# Patient Record
Sex: Female | Born: 1995 | Race: White | Hispanic: No | Marital: Single | Smoking: Current every day smoker
Health system: Southern US, Community
[De-identification: ages and names within clinical notes are randomized; demographics above are authoritative.]

## PROBLEM LIST (undated history)

## (undated) DIAGNOSIS — H53009 Unspecified amblyopia, unspecified eye: Secondary | ICD-10-CM

## (undated) DIAGNOSIS — H5200 Hypermetropia, unspecified eye: Secondary | ICD-10-CM

## (undated) HISTORY — DX: Hypermetropia, unspecified eye: H52.00

## (undated) HISTORY — DX: Unspecified amblyopia, unspecified eye: H53.009

---
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
## 2017-03-07 ENCOUNTER — Encounter: Payer: Self-pay | Admitting: Physician Assistant

## 2017-04-05 ENCOUNTER — Emergency Department (HOSPITAL_COMMUNITY)
Admission: EM | Admit: 2017-04-05 | Discharge: 2017-04-05 | Disposition: A | Discharge status: Home / Self Care | Payer: BLUE CROSS/BLUE SHIELD | Attending: Emergency Medicine | Admitting: Emergency Medicine

## 2017-04-05 ENCOUNTER — Emergency Department (HOSPITAL_COMMUNITY): Payer: BLUE CROSS/BLUE SHIELD

## 2017-04-05 ENCOUNTER — Encounter (HOSPITAL_COMMUNITY): Payer: Self-pay | Admitting: *Deleted

## 2017-04-05 DIAGNOSIS — R1011 Right upper quadrant pain: Secondary | ICD-10-CM | POA: Diagnosis not present

## 2017-04-05 DIAGNOSIS — K8050 Calculus of bile duct without cholangitis or cholecystitis without obstruction: Secondary | ICD-10-CM | POA: Insufficient documentation

## 2017-04-05 DIAGNOSIS — K828 Other specified diseases of gallbladder: Secondary | ICD-10-CM

## 2017-04-05 DIAGNOSIS — R110 Nausea: Secondary | ICD-10-CM | POA: Insufficient documentation

## 2017-04-05 LAB — URINALYSIS, ROUTINE W REFLEX MICROSCOPIC
Bilirubin Urine: NEGATIVE
GLUCOSE, UA: NEGATIVE mg/dL
HGB URINE DIPSTICK: NEGATIVE
KETONES UR: NEGATIVE mg/dL
NITRITE: NEGATIVE
PROTEIN: NEGATIVE mg/dL
Specific Gravity, Urine: 1.025 (ref 1.005–1.030)
pH: 5 (ref 5.0–8.0)

## 2017-04-05 LAB — CBC
HCT: 46.8 % — ABNORMAL HIGH (ref 36.0–46.0)
Hemoglobin: 15.6 g/dL — ABNORMAL HIGH (ref 12.0–15.0)
MCH: 31 pg (ref 26.0–34.0)
MCHC: 33.3 g/dL (ref 30.0–36.0)
MCV: 93 fL (ref 78.0–100.0)
Platelets: 255 10*3/uL (ref 150–400)
RBC: 5.03 MIL/uL (ref 3.87–5.11)
RDW: 12.3 % (ref 11.5–15.5)
WBC: 8 10*3/uL (ref 4.0–10.5)

## 2017-04-05 LAB — COMPREHENSIVE METABOLIC PANEL
ALT: 19 U/L (ref 14–54)
AST: 20 U/L (ref 15–41)
Albumin: 4.4 g/dL (ref 3.5–5.0)
Alkaline Phosphatase: 73 U/L (ref 38–126)
Anion gap: 6 (ref 5–15)
BILIRUBIN TOTAL: 0.4 mg/dL (ref 0.3–1.2)
BUN: 9 mg/dL (ref 6–20)
CHLORIDE: 106 mmol/L (ref 101–111)
CO2: 27 mmol/L (ref 22–32)
Calcium: 9.4 mg/dL (ref 8.9–10.3)
Creatinine, Ser: 0.67 mg/dL (ref 0.44–1.00)
GFR calc Af Amer: >60 mL/min (ref >60)
GFR calc non Af Amer: >60 mL/min (ref >60)
Glucose, Bld: 94 mg/dL (ref 65–99)
Potassium: 4.1 mmol/L (ref 3.5–5.1)
Sodium: 139 mmol/L (ref 135–145)
Total Protein: 7.6 g/dL (ref 6.5–8.1)

## 2017-04-05 LAB — LIPASE, BLOOD: LIPASE: 18 U/L (ref 11–51)

## 2017-04-05 LAB — I-STAT BETA HCG BLOOD, ED (MC, WL, AP ONLY): I-stat hCG, quantitative: <5 m[IU]/mL

## 2017-04-05 MED ORDER — NAPROXEN 250 MG PO TABS: 250.0000 mg | ORAL_TABLET | Freq: Two times a day (BID) | ORAL | 0 refills | Status: DC

## 2017-04-05 MED ORDER — GI COCKTAIL ~~LOC~~
30.0000 mL | Freq: Once | ORAL | Status: AC
  Administered 2017-04-05: 30 mL via ORAL
  Filled 2017-04-05: qty 30

## 2017-04-05 MED ORDER — ONDANSETRON 4 MG PO TBDP: 4.0000 mg | ORAL_TABLET | Freq: Three times a day (TID) | ORAL | 0 refills | Status: DC | PRN

## 2017-04-05 MED ORDER — ONDANSETRON HCL 4 MG/2ML IJ SOLN
4.0000 mg | INTRAMUSCULAR | Status: AC
  Administered 2017-04-05: 4 mg via INTRAVENOUS
  Filled 2017-04-05: qty 2

## 2017-04-05 MED ORDER — ONDANSETRON 4 MG PO TBDP
4.0000 mg | ORAL_TABLET | Freq: Once | ORAL | Status: AC
  Administered 2017-04-05: 4 mg via ORAL
  Filled 2017-04-05: qty 1

## 2017-04-05 MED ORDER — OMEPRAZOLE 20 MG PO CPDR: 20.0000 mg | DELAYED_RELEASE_CAPSULE | Freq: Every day | ORAL | 0 refills | Status: DC

## 2017-04-05 MED ORDER — MORPHINE SULFATE (PF) 4 MG/ML IV SOLN
4.0000 mg | Freq: Once | INTRAVENOUS | Status: AC
  Administered 2017-04-05: 4 mg via INTRAVENOUS
  Filled 2017-04-05: qty 1

## 2017-04-05 NOTE — ED Provider Notes (Signed)
[facility]EM[facility]ENCY DEPT Provider Note   CSN: [id number] Arrival date & time: 04/05/17  1315     History   Chief Complaint Chief Complaint  Patient presents with  . Abdominal Pain    HPI Robin Morris is a 21 y.o. female.  Robin Morris is a 21 y.o. Female who presents to the ED with her mother complaining of right upper quadrant abdominal pain ongoing for the past 5 days. Patient was seen at urgent care today and was sent for evaluation in the emergency department. Patient reports for 5 days she's had intermittent pain to her right upper quadrant. She reports her pain is worse about 2 hours after eating. She also reports associated burping and belching and symptoms of acid reflux. She has taken nothing for treatment of her symptoms today. She reports having some nausea and vomiting earlier in the week, but none currently. No previous abdominal surgeries. She denies fevers, chest pain, shortness of breath, lower abdominal pain, urinary symptoms, vaginal bleeding, vaginal discharge or rashes. LMP was 03/06/17.    The history is provided by the patient, a parent and medical records. No language interpreter was used.    Past Medical History:  Diagnosis Date  . Amblyopia   . Farsightedness     Patient Active Problem List   Diagnosis Date Noted  . Constipation 02/22/2016  . Vitamin D deficiency 02/22/2016  . Medication management 02/22/2016  . Acne 05/16/2012    History reviewed. No pertinent surgical history.  OB History    No data available       Home Medications    Prior to Admission medications   Medication Sig Start Date End Date Taking? Authorizing Provider  naproxen (NAPROSYN) 250 MG tablet Take 1 tablet (250 mg total) by mouth 2 (two) times daily with a meal. 04/05/17   Everlene Farrier, PA-C  omeprazole (PRILOSEC) 20 MG capsule Take 1 capsule (20 mg total) by mouth daily. 04/05/17   Everlene Farrier, PA-C  ondansetron (ZOFRAN ODT) 4 MG disintegrating tablet Take 1  tablet (4 mg total) by mouth every 8 (eight) hours as needed for nausea or vomiting. 04/05/17   Everlene Farrier, PA-C  SULFACLEANSE 8/4 8-4 % SUSP  12/15/15   Historical Provider, MD  TRI-LO-ESTARYLLA 0.18/0.215/0.25 MG-25 MCG tab  02/03/16   Historical Provider, MD    Family History Family History  Problem Relation Age of Onset  . Hypertension Father   . Heart disease Father     Social History Social History  Substance Use Topics  . Smoking status: Never Smoker  . Smokeless tobacco: Never Used  . Alcohol use Yes     Allergies   Patient has no known allergies.   Review of Systems Review of Systems  Constitutional: Negative for chills and fever.  HENT: Negative for congestion and sore throat.   Eyes: Negative for visual disturbance.  Respiratory: Negative for cough and shortness of breath.   Cardiovascular: Negative for chest pain.  Gastrointestinal: Positive for abdominal pain and nausea. Negative for abdominal distention, blood in stool, diarrhea and vomiting.  Genitourinary: Negative for difficulty urinating, dysuria, frequency, hematuria, urgency, vaginal bleeding and vaginal discharge.  Musculoskeletal: Negative for back pain and neck pain.  Skin: Negative for rash.  Neurological: Negative for headaches.     Physical Exam Updated Vital Signs BP 106/62   Pulse 75   Temp 98.8 F (37.1 C) (Oral)   Resp 16   LMP 03/06/2017   SpO2 98%   Physical Exam  Constitutional: She appears well-developed and well-nourished. No distress.  Nontoxic appearing.  HENT:  Head: Normocephalic and atraumatic.  Mouth/Throat: Oropharynx is clear and moist.  Eyes: Conjunctivae are normal. Pupils are equal, round, and reactive to light. Right eye exhibits no discharge. Left eye exhibits no discharge.  Neck: Neck supple.  Cardiovascular: Normal rate, regular rhythm, normal heart sounds and intact distal pulses.  Exam reveals no gallop and no friction rub.   No murmur  heard. Pulmonary/Chest: Effort normal and breath sounds normal. No respiratory distress. She has no wheezes. She has no rales.  Abdominal: Soft. Bowel sounds are normal. She exhibits no distension and no mass. There is tenderness. There is no rebound and no guarding.  Right upper quadrant tenderness to palpation. No CVA or flank tenderness. No lower abdominal tenderness to palpation. No peritoneal signs.  Musculoskeletal: She exhibits no edema.  Lymphadenopathy:    She has no cervical adenopathy.  Neurological: She is alert. Coordination normal.  Skin: Skin is warm and dry. No rash noted. She is not diaphoretic. No erythema. No pallor.  Psychiatric: She has a normal mood and affect. Her behavior is normal.  Nursing note and vitals reviewed.    ED Treatments / Results  Labs (all labs ordered are listed, but only abnormal results are displayed) Labs Reviewed  CBC - Abnormal; Notable for the following:       Result Value   Hemoglobin 15.6 (*)    HCT 46.8 (*)    All other components within normal limits  URINALYSIS, ROUTINE W REFLEX MICROSCOPIC - Abnormal; Notable for the following:    APPearance HAZY (*)    Leukocytes, UA TRACE (*)    Bacteria, UA RARE (*)    Squamous Epithelial / LPF 6-30 (*)    All other components within normal limits  LIPASE, BLOOD  COMPREHENSIVE METABOLIC PANEL  I-STAT BETA HCG BLOOD, ED ([facility], [facility], [facility] ONLY)    EKG  EKG Interpretation None       Radiology US Abdomen Limited  Result Date: 04/05/2017 CLINICAL DATA:  Right upper quadrant abdominal pain EXAM: US ABDOMEN LIMITED - RIGHT UPPER QUADRANT COMPARISON:  04/15/2014 CT FINDINGS: Gallbladder: No gallstones or wall thickening visualized. There may be some minimal dependent biliary sludge. No sonographic Murphy sign noted by sonographer. Common bile duct: Diameter: 2.5 mm Liver: No focal lesion identified. Within normal limits in parenchymal echogenicity. IMPRESSION: No evidence cholecystitis. There may be  some minimal dependent biliary sludge within the gallbladder on some of the images provided. Electronically Signed   By: Tollie Eth M.D.   On: 04/05/2017 19:19    Procedures Procedures (including critical care time)  Medications Ordered in ED Medications  gi cocktail (Maalox,Lidocaine,Donnatal) (30 mLs Oral Given 04/05/17 1716)  ondansetron (ZOFRAN-ODT) disintegrating tablet 4 mg (4 mg Oral Given 04/05/17 1715)  morphine 4 MG/ML injection 4 mg (4 mg Intravenous Given 04/05/17 1847)  ondansetron (ZOFRAN) injection 4 mg (4 mg Intravenous Given 04/05/17 1844)     Initial Impression / Assessment and Plan / ED Course  I have reviewed the triage vital signs and the nursing notes.  Pertinent labs & imaging results that were available during my care of the patient were reviewed by me and considered in my medical decision making (see chart for details).    This is a 21 y.o. Female who presents to the ED with her mother complaining of right upper quadrant abdominal pain ongoing for the past 5 days. Patient was seen at urgent care  today and was sent for evaluation in the emergency department. Patient reports for 5 days she's had intermittent pain to her right upper quadrant. She reports her pain is worse about 2 hours after eating. She also reports associated burping and belching and symptoms of acid reflux. She has taken nothing for treatment of her symptoms today. She reports having some nausea and vomiting earlier in the week, but none currently.  On exam the patient is afebrile and non-toxic appearing. Her abdomen is soft and she has right upper quadrant abdominal tenderness to palpation. No peritoneal signs. Pregnancy test is negative. Urinalysis is nitrite negative with trace leukocytes patient denies any urinary symptoms. Lipase is within normal limits. CMP is unremarkable. Normal liver enzymes. CBC is unremarkable. No leukocytosis. Ultrasound of her right upper quadrant reveals no evidence of  cholecystitis. It does show some biliary sludge within the gallbladder. Suspect biliary colic and gallbladder sludge is causing this patient's pain. Reevaluation patient reports she is feeling better. She is tolerating by mouth. Will discharge at this time with prescriptions for Zofran, naproxen and omeprazole. I encouraged her to follow-up with general surgery practice for possible elective cholecystectomy. I discussed strict and specific return precautions with the patient. I advised the patient to follow-up with their primary care provider this week. I advised the patient to return to the emergency department with new or worsening symptoms or new concerns. The patient verbalized understanding and agreement with plan.      Final Clinical Impressions(s) / ED Diagnoses   Final diagnoses:  Gallbladder sludge  Biliary colic  Nausea    New Prescriptions New Prescriptions   NAPROXEN (NAPROSYN) 250 MG TABLET    Take 1 tablet (250 mg total) by mouth 2 (two) times daily with a meal.   OMEPRAZOLE (PRILOSEC) 20 MG CAPSULE    Take 1 capsule (20 mg total) by mouth daily.   ONDANSETRON (ZOFRAN ODT) 4 MG DISINTEGRATING TABLET    Take 1 tablet (4 mg total) by mouth every 8 (eight) hours as needed for nausea or vomiting.     Everlene Farrier, PA-C 04/05/17 2007    Gwyneth Sprout, MD 04/05/17 [phone]

## 2017-04-05 NOTE — ED Notes (Signed)
Pt verbalized understanding discharge instructions and denies any further needs or questions at this time. VS stable, ambulatory and steady gait.   

## 2017-04-05 NOTE — ED Triage Notes (Signed)
Pt reports R abd pain intermittent x 6 days, sent from UC for r/o gallbladder work up, pt reports x 2 vomiting episode since onset of symptoms, pt denies current n/v, reports x 1 liquid stool today, pt reports pain radiating to to mid back, A&O x4

## 2017-04-08 ENCOUNTER — Encounter: Payer: Self-pay | Admitting: Physician Assistant

## 2017-04-08 ENCOUNTER — Ambulatory Visit (INDEPENDENT_AMBULATORY_CARE_PROVIDER_SITE_OTHER): Payer: BLUE CROSS/BLUE SHIELD | Admitting: Physician Assistant

## 2017-04-08 VITALS — BP 118/74 | HR 103 | Temp 97.3°F | Resp 16 | Ht 66.0 in | Wt 170.6 lb

## 2017-04-08 DIAGNOSIS — E663 Overweight: Secondary | ICD-10-CM

## 2017-04-08 DIAGNOSIS — K829 Disease of gallbladder, unspecified: Secondary | ICD-10-CM

## 2017-04-08 MED ORDER — PHENTERMINE HCL 37.5 MG PO TABS: 37.5000 mg | ORAL_TABLET | Freq: Every day | ORAL | 0 refills | Status: DC

## 2017-04-08 NOTE — Progress Notes (Signed)
   Subjective:    Patient ID: Robin Morris, female    DOB: 10-Apr-1996, 21 y.o.   MRN: [id number]  HPI 21 y.o. WF presents for follow up from ER but also has issues with weight loss. She went to the ER with gallbladder pain, now has visit with surgeon on may 16th. Normal labs in the ER. No fever, chills.   BMI is Body mass index is 27.54 kg/m., she is working on diet and exercise but has been eating grits, salads of [employer] and regular soda.  Wt Readings from Last 3 Encounters:  04/08/17 170 lb 9.6 oz (77.4 kg) (92 %, Z= 1.43)*  02/22/16 174 lb 12.8 oz (79.3 kg) (94 %, Z= 1.59)*  11/11/14 150 lb (68 kg) (86 %, Z= 1.10)*   * Growth percentiles are based on CDC 2-20 Years data.   Blood pressure 118/74, pulse (!) 103, temperature 97.3 F (36.3 C), resp. rate 16, height  (1.676 m), weight 170 lb 9.6 oz (77.4 kg), last menstrual period 03/08/2017, SpO2 99 %.   Review of Systems  Constitutional: Negative.   HENT: Negative.  Negative for sore throat.   Eyes: Negative.   Respiratory: Negative for cough, choking, shortness of breath and wheezing.   Cardiovascular: Negative.  Negative for chest pain.  Gastrointestinal: Positive for abdominal distention, abdominal pain and nausea. Negative for anal bleeding, blood in stool, constipation, diarrhea, rectal pain and vomiting.  Genitourinary: Negative.   Musculoskeletal: Negative.   Skin: Negative.   Neurological: Negative.   Hematological: Negative.   Psychiatric/Behavioral: Negative.        Objective:   Physical Exam  Constitutional: She is oriented to person, place, and time. She appears well-developed and well-nourished.  HENT:  Head: Normocephalic and atraumatic.  Right Ear: External ear normal.  Left Ear: External ear normal.  Mouth/Throat: Oropharynx is clear and moist.  Eyes: Conjunctivae and EOM are normal. Pupils are equal, round, and reactive to light.  Neck: Normal range of motion. Neck supple. No thyromegaly present.   Cardiovascular: Normal rate, regular rhythm and normal heart sounds.  Exam reveals no gallop and no friction rub.   No murmur heard. Pulmonary/Chest: Effort normal and breath sounds normal. No respiratory distress. She has no wheezes.  Abdominal: Soft. Bowel sounds are normal. She exhibits no shifting dullness, no distension, no abdominal bruit, no pulsatile midline mass and no mass. There is no hepatosplenomegaly. There is tenderness in the right upper quadrant. There is positive Murphy's sign. There is no rigidity, no rebound, no guarding, no CVA tenderness and no tenderness at McBurney's point. No hernia.  Musculoskeletal: Normal range of motion.  Lymphadenopathy:    She has no cervical adenopathy.  Neurological: She is alert and oriented to person, place, and time.  Skin: Skin is warm and dry.  Psychiatric: She has a normal mood and affect.       Assessment & Plan:    Overweight  - long discussion about weight loss, diet, and exercise --     phentermine (ADIPEX-P) 37.5 MG tablet; Take 1 tablet (37.5 mg total) by mouth daily before breakfast.  Gallbladder attack Diet discussed Follow up surgery Go to ER if any worsening symptoms  1 month will check labs that visit at [facility]   No future appointments.

## 2017-04-08 NOTE — Patient Instructions (Addendum)
Cholelithiasis Cholelithiasis is a form of gallbladder disease in which gallstones form in the gallbladder. The gallbladder is an organ that stores bile. Bile is made in the liver, and it helps to digest fats. Gallstones begin as small crystals and slowly grow into stones. They may cause no symptoms until the gallbladder tightens (contracts) and a gallstone is blocking the duct (gallbladder attack), which can cause pain. Cholelithiasis is also referred to as gallstones. There are two main types of gallstones:  Cholesterol stones. These are made of hardened cholesterol and are usually yellow-green in color. They are the most common type of gallstone. Cholesterol is a white, waxy, fat-like substance that is made in the liver.  Pigment stones. These are dark in color and are made of a red-yellow substance that forms when hemoglobin from red blood cells breaks down (bilirubin). What are the causes? This condition may be caused by an imbalance in the substances that bile is made of. This can happen if the bile:  Has too much bilirubin.  Has too much cholesterol.  Does not have enough bile salts. These salts help the body absorb and digest fats. In some cases, this condition can also be caused by the gallbladder not emptying completely or often enough. What increases the risk? The following factors may make you more likely to develop this condition:  Being female.  Having multiple pregnancies. Health care providers sometimes advise removing diseased gallbladders before future pregnancies.  Eating a diet that is heavy in fried foods, fat, and refined carbohydrates, like white bread and white rice.  Being obese.  Being older than age 40.  Prolonged use of medicines that contain female hormones (estrogen).  Having diabetes mellitus.  Rapidly losing weight.  Having a family history of gallstones.  Being of American Indian or [place] descent.  Having an intestinal disease such as Crohn  disease.  Having metabolic syndrome.  Having cirrhosis.  Having severe types of anemia such as sickle cell anemia. What are the signs or symptoms? In most cases, there are no symptoms. These are known as silent gallstones. If a gallstone blocks the bile ducts, it can cause a gallbladder attack. The main symptom of a gallbladder attack is sudden pain in the upper right abdomen. The pain usually comes at night or after eating a large meal. The pain can last for one or several hours and can spread to the right shoulder or chest. If the bile duct is blocked for more than a few hours, it can cause infection or inflammation of the gallbladder, liver, or pancreas, which may cause:  Nausea.  Vomiting.  Abdominal pain that lasts for 5 hours or more.  Fever or chills.  Yellowing of the skin or the whites of the eyes (jaundice).  Dark urine.  Light-colored stools. How is this diagnosed? This condition may be diagnosed based on:  A physical exam.  Your medical history.  An ultrasound of your gallbladder.  CT scan.  MRI.  Blood tests to check for signs of infection or inflammation.  A scan of your gallbladder and bile ducts (biliary system) using nonharmful radioactive material and special cameras that can see the radioactive material (cholescintigram). This test checks to see how your gallbladder contracts and whether bile ducts are blocked.  Inserting a small tube with a camera on the end (endoscope) through your mouth to inspect bile ducts and check for blockages (endoscopic retrograde cholangiopancreatogram). How is this treated? Treatment for gallstones depends on the severity of the condition. Silent   gallstones do not need treatment. If the gallstones cause a gallbladder attack or other symptoms, treatment may be required. Options for treatment include:  Surgery to remove the gallbladder (cholecystectomy). This is the most common treatment.  Medicines to dissolve gallstones.  These are most effective at treating small gallstones. You may need to take medicines for up to 6-12 months.  Shock wave treatment (extracorporeal biliary lithotripsy). In this treatment, an ultrasound machine sends shock waves to the gallbladder to break gallstones into smaller pieces. These pieces can then be passed into the intestines or be dissolved by medicine. This is rarely used.  Removing gallstones through endoscopic retrograde cholangiopancreatogram. A small basket can be attached to the endoscope and used to capture and remove gallstones. Follow these instructions at home:  Take over-the-counter and prescription medicines only as told by your health care provider.  Maintain a healthy weight and follow a healthy diet. This includes:  Reducing fatty foods, such as fried food.  Reducing refined carbohydrates, like white bread and white rice.  Increasing fiber. Aim for foods like almonds, fruit, and beans.  Keep all follow-up visits as told by your health care provider. This is important. Contact a health care provider if:  You think you have had a gallbladder attack.  You have been diagnosed with silent gallstones and you develop abdominal pain or indigestion. Get help right away if:  You have pain from a gallbladder attack that lasts for more than 2 hours.  You have abdominal pain that lasts for more than 5 hours.  You have a fever or chills.  You have persistent nausea and vomiting.  You develop jaundice.  You have dark urine or light-colored stools. Summary  Cholelithiasis (also called gallstones) is a form of gallbladder disease in which gallstones form in the gallbladder.  This condition is caused by an imbalance in the substances that make up bile. This can happen if the bile has too much cholesterol, too much bilirubin, or not enough bile salts.  You are more likely to develop this condition if you are female, pregnant, using medicines with estrogen, obese,  older than age 19, or have a family history of gallstones. You may also develop gallstones if you have diabetes, an intestinal disease, cirrhosis, or metabolic syndrome.  Treatment for gallstones depends on the severity of the condition. Silent gallstones do not need treatment.  If gallstones cause a gallbladder attack or other symptoms, treatment may be needed. The most common treatment is surgery to remove the gallbladder. This information is not intended to replace advice given to you by your health care provider. Make sure you discuss any questions you have with your health care provider. Document Released: 11/22/2005 Document Revised: 08/12/2016 Document Reviewed: 08/12/2016 Elsevier Interactive Patient Education  2017 [employer]  ADD ON VITAMIN D 5000 IU AND ADD ON B COMPLEX   Get my fitness pal or lose it app on your phone and bring me back a food diary  Phentermine  While taking the medication we may ask that you come into the office once a month or once every 2-3 months to monitor your weight, blood pressure, and heart rate. In addition we can help answer your questions about diet, exercise, and help you every step of the way with your weight loss journey. Sometime it is helpful if you bring in a food diary or use an app on your phone such as myfitnesspal to record your calorie intake, especially in the beginning.   You can start  out on 1/3 to 1/2 a pill in the morning and if you are tolerating it well you can increase to one pill daily. I also have some patients that take 1/3 or 1/2 at lunch to help prevent night time eating.  This medication is cheapest CASH pay at [facility] OR [employer] OR [employer] OR [facility] is 14-17 dollars and you do NOT need a membership to get meds from there.    What is this medicine? PHENTERMINE (FEN ter meen) decreases your appetite. This medicine is intended to be used in addition to a healthy reduced calorie diet and exercise. The best results are  achieved this way. This medicine is only indicated for short-term use. Eventually your weight loss may level out and the medication will no longer be needed.   How should I use this medicine? Take this medicine by mouth. Follow the directions on the prescription label. The tablets should stay in the bottle until immediately before you take your dose. Take your doses at regular intervals. Do not take your medicine more often than directed.  Overdosage: If you think you have taken too much of this medicine contact a poison control center or emergency room at once. NOTE: This medicine is only for you. Do not share this medicine with others.  What if I miss a dose? If you miss a dose, take it as soon as you can. If it is almost time for your next dose, take only that dose. Do not take double or extra doses. Do not increase or in any way change your dose without consulting your doctor.  What should I watch for while using this medicine? Notify your physician immediately if you become short of breath while doing your normal activities. Do not take this medicine within 6 hours of bedtime. It can keep you from getting to sleep. Avoid drinks that contain caffeine and try to stick to a regular bedtime every night. Do not stand or sit up quickly, especially if you are an older patient. This reduces the risk of dizzy or fainting spells. Avoid alcoholic drinks.  What side effects may I notice from receiving this medicine? Side effects that you should report to your doctor or health care professional as soon as possible: -chest pain, palpitations -depression or severe changes in mood -increased blood pressure -irritability -nervousness or restlessness -severe dizziness -shortness of breath -problems urinating -unusual swelling of the legs -vomiting  Side effects that usually do not require medical attention (report to your doctor or health care professional if they continue or are bothersome): -blurred  vision or other eye problems -changes in sexual ability or desire -constipation or diarrhea -difficulty sleeping -dry mouth or unpleasant taste -headache -nausea This list may not describe all possible side effects. Call your doctor for medical advice about side effects. You may report side effects to FDA at 1-800-FDA-1088.    Simple math prevails.    1st - exercise does not produce significant weight loss - at best one converts fat into muscle , "bulks up", loses inches, but usually stays "weight neutral"     2nd - think of your body weightas a check book: If you eat more calories than you burn up - you save money or gain weight .... Or if you spend more money than you put in the check book, ie burn up more calories than you eat, then you lose weight     3rd - if you walk or run 1 mile, you burn up 100  calories - you have to burn up 3,500 calories to lose 1 pound, ie you have to walk/run 35 miles to lose 1 measly pound. So if you want to lose 10 #, then you have to walk/run 350 miles, so.... clearly exercise is not the solution.     4. So if you consume 1,500 calories, then you have to burn up the equivalent of 15 miles to stay weight neutral - It also stands to reason that if you consume 1,500 cal/day and don't lose weight, then you must be burning up about 1,500 cals/day to stay weight neutral.     5. If you really want to lose weight, you must cut your calorie intake 300 calories /day and at that rate you should lose about 1 # every 3 days.   6. Please purchase Dr Francis Dowse Fuhrman's book(s) "The End of Dieting" & "Eat to Live" . It has some great concepts and recipes.

## 2017-04-22 DIAGNOSIS — R1011 Right upper quadrant pain: Secondary | ICD-10-CM | POA: Diagnosis not present

## 2017-04-25 NOTE — Progress Notes (Signed)
  21 y.o.female presents for a follow up after being on phentermine for weight loss.  While on the medication they have lost 11 lbs since last visit. They deny palpitations, anxiety, trouble sleeping, elevated BP. States her gallbladder is better with change in diet.   BMI is Body mass index is 26.08 kg/m., she is working on diet and exercise. Wt Readings from Last 3 Encounters:  04/26/17 161 lb 9.6 oz (73.3 kg) (89 %, Z= 1.22)*  04/08/17 170 lb 9.6 oz (77.4 kg) (92 %, Z= 1.43)*  02/22/16 174 lb 12.8 oz (79.3 kg) (94 %, Z= 1.59)*   * Growth percentiles are based on CDC 2-20 Years data.    Typical breakfast: none Typical lunch: veggie burger Typical dinner: meat  Medications: Current Outpatient Prescriptions on File Prior to Visit  Medication Sig Dispense Refill  . naproxen (NAPROSYN) 250 MG tablet Take 1 tablet (250 mg total) by mouth 2 (two) times daily with a meal. 30 tablet 0  . phentermine (ADIPEX-P) 37.5 MG tablet Take 1 tablet (37.5 mg total) by mouth daily before breakfast. 30 tablet 0  . TRI-LO-ESTARYLLA 0.18/0.215/0.25 MG-25 MCG tab      No current facility-administered medications on file prior to visit.      Physical exam: Vitals:   04/26/17 1036  BP: 116/78  Pulse: (!) 101  Resp: 14  Temp: 97.7 F (36.5 C)   Physical Exam  Review of Systems  Constitutional: Negative.   HENT: Negative.   Eyes: Negative.   Respiratory: Negative.   Cardiovascular: Negative.   Gastrointestinal: Negative.   Genitourinary: Negative.   Musculoskeletal: Negative.   Skin: Negative.        + acne  Neurological: Negative.   Endo/Heme/Allergies: Negative.   Psychiatric/Behavioral: Negative.    Assessment: Obesity with co morbid conditions.   Plan: General weight loss/lifestyle modification strategies discussed (elicit support from others; identify saboteurs; non-food rewards, etc). Continue food diary Continue restricted calorie diet Continue daily exercise as well as  behavior modification such as walking further away, putting down the fork, having a plan, using stairs, etc.  Medication: phentermine  Follow up in 2 months Future Appointments Date Time Provider Department Center  06/21/2017 9:45 AM Quentin Mullingollier, Martia Dalby, PA-C [facility] None

## 2017-04-26 ENCOUNTER — Encounter: Payer: Self-pay | Admitting: Physician Assistant

## 2017-04-26 ENCOUNTER — Ambulatory Visit (INDEPENDENT_AMBULATORY_CARE_PROVIDER_SITE_OTHER): Payer: BLUE CROSS/BLUE SHIELD | Admitting: Physician Assistant

## 2017-04-26 VITALS — BP 116/78 | HR 101 | Temp 97.7°F | Resp 14 | Ht 66.0 in | Wt 161.6 lb

## 2017-04-26 DIAGNOSIS — E663 Overweight: Secondary | ICD-10-CM

## 2017-04-26 DIAGNOSIS — R635 Abnormal weight gain: Secondary | ICD-10-CM | POA: Diagnosis not present

## 2017-04-26 MED ORDER — PHENTERMINE HCL 37.5 MG PO TABS: 37.5000 mg | ORAL_TABLET | Freq: Every day | ORAL | 1 refills | Status: DC

## 2017-04-26 NOTE — Patient Instructions (Addendum)
Increase water to 80-100 oz a day Eat breakfast add protein, can be small  We want weight loss that will last so you should lose 1-2 pounds a week.  THAT IS IT! Please pick THREE things a month to change. Once it is a habit check off the item. Then pick another three items off the list to become habits.  If you are already doing a habit on the list GREAT!  Cross that item off! o Don't drink your calories. Ie, alcohol, soda, fruit juice, and sweet tea.  o Drink more water. Drink a glass when you feel hungry or before each meal.  o Eat breakfast - Complex carb and protein (likeDannon light and fit yogurt, oatmeal, fruit, eggs, [place] bacon). o Measure your cereal.  Eat no more than one cup a day. (ie [place]) o Eat an apple a day. o Add a vegetable a day. o Try a new vegetable a month. o Use Pam! Stop using oil or butter to cook. o Don't finish your plate or use smaller plates. o Share your dessert. o Eat sugar free Jello for dessert or frozen grapes. o Don't eat 2-3 hours before bed. o Switch to whole wheat bread, pasta, and brown rice. o Make healthier choices when you eat out. No fries! o Pick baked chicken, NOT fried. o Don't forget to SLOW DOWN when you eat. It is not going anywhere.  o Take the stairs. o Park far away in the parking lot o [employer] (or weights) for 10 minutes while watching TV. o Walk at work for 10 minutes during break. o Walk outside 1 time a week with your friend, kids, dog, or significant other. o Start a walking group at church. o Walk the mall as much as you can tolerate.  o Keep a food diary. o Weigh yourself daily. o Walk for 15 minutes 3 days per week. o Cook at home more often and eat out less.  If life happens and you go back to old habits, it is okay.  Just start over. You can do it!   If you experience chest pain, get short of breath, or tired during the exercise, please stop immediately and inform your doctor.     Simple math prevails.     1st - exercise does not produce significant weight loss - at best one converts fat into muscle , "bulks up", loses inches, but usually stays "weight neutral"     2nd - think of your body weightas a check book: If you eat more calories than you burn up - you save money or gain weight .... Or if you spend more money than you put in the check book, ie burn up more calories than you eat, then you lose weight     3rd - if you walk or run 1 mile, you burn up 100 calories - you have to burn up 3,500 calories to lose 1 pound, ie you have to walk/run 35 miles to lose 1 measly pound. So if you want to lose 10 #, then you have to walk/run 350 miles, so.... clearly exercise is not the solution.     4. So if you consume 1,500 calories, then you have to burn up the equivalent of 15 miles to stay weight neutral - It also stands to reason that if you consume 1,500 cal/day and don't lose weight, then you must be burning up about 1,500 cals/day to stay weight neutral.     5. If  you really want to lose weight, you must cut your calorie intake 300 calories /day and at that rate you should lose about 1 # every 3 days.   6. Please purchase Dr Francis DowseJoel Fuhrman's book(s) "The End of Dieting" & "Eat to Live" . It has some great concepts and recipes.     Before you even begin to attack a weight-loss plan, it pays to remember this: You are not fat. You have fat. Losing weight isn't about blame or shame; it's simply another achievement to accomplish. Dieting is like any other skill-you have to buckle down and work at it. As long as you act in a smart, reasonable way, you'll ultimately get where you want to be. Here are some weight loss pearls for you.  1. It's Not a Diet. It's a Lifestyle Thinking of a diet as something you're on and suffering through only for the short term doesn't work. To shed weight and keep it off, you need to make permanent changes to the way you eat. It's OK to indulge occasionally, of course, but if you  cut calories temporarily and then revert to your old way of eating, you'll gain back the weight quicker than you can say yo-yo. Use it to lose it. Research shows that one of the best predictors of long-term weight loss is how many pounds you drop in the first month. For that reason, nutritionists often suggest being stricter for the first two weeks of your new eating strategy to build momentum. Cut out added sugar and alcohol and avoid unrefined carbs. After that, figure out how you can reincorporate them in a way that's healthy and maintainable.  2. There's a Right Way to Exercise Working out burns calories and fat and boosts your metabolism by building muscle. But those trying to lose weight are notorious for overestimating the number of calories they burn and underestimating the amount they take in. Unfortunately, your system is biologically programmed to hold on to extra pounds and that means when you start exercising, your body senses the deficit and ramps up its hunger signals. If you're not diligent, you'll eat everything you burn and then some. Use it to lose it. Cardio gets all the exercise glory, but strength and interval training are the real heroes. They help you build lean muscle, which in turn increases your metabolism and calorie-burning ability 3. Don't Overreact to Mild Hunger Some people have a hard time losing weight because of hunger anxiety. To them, being hungry is bad-something to be avoided at all costs-so they carry snacks with them and eat when they don't need to. Others eat because they're stressed out or bored. While you never want to get to the point of being ravenous (that's when bingeing is likely to happen), a hunger pang, a craving, or the fact that it's 3:00 p.m. should not send you racing for the vending machine or obsessing about the energy bar in your purse. Ideally, you should put off eating until your stomach is growling and it's difficult to concentrate.  Use it to lose  it. When you feel the urge to eat, use the HALT method. Ask yourself, Am I really hungry? Or am I angry or anxious, lonely or bored, or tired? If you're still not certain, try the apple test. If you're truly hungry, an apple should seem delicious; if it doesn't, something else is going on. Or you can try drinking water and making yourself busy, if you are still hungry try a healthy snack.  4. Not All Calories Are Created Equal The mechanics of weight loss are pretty simple: Take in fewer calories than you use for energy. But the kind of food you eat makes all the difference. Processed food that's high in saturated fat and refined starch or sugar can cause inflammation that disrupts the hormone signals that tell your brain you're full. The result: You eat a lot more.  Use it to lose it. Clean up your diet. Swap in whole, unprocessed foods, including vegetables, lean protein, and healthy fats that will fill you up and give you the biggest nutritional bang for your calorie buck. In a few weeks, as your brain starts receiving regular hunger and fullness signals once again, you'll notice that you feel less hungry overall and naturally start cutting back on the amount you eat.  5. Protein, Produce, and Plant-Based Fats Are Your Weight-Loss Trinity Here's why eating the three Ps regularly will help you drop pounds. Protein fills you up. You need it to build lean muscle, which keeps your metabolism humming so that you can torch more fat. People in a weight-loss program who ate double the recommended daily allowance for protein (about 110 grams for a 150-pound woman) lost 70 percent of their weight from fat, while people who ate the RDA lost only about 40 percent, one study found. Produce is packed with filling fiber. "It's very difficult to consume too many calories if you're eating a lot of vegetables. Example: Three cups of broccoli is a lot of food, yet only 93 calories. (Fruit is another story. It can be easy to  overeat and can contain a lot of calories from sugar, so be sure to monitor your intake.) Plant-based fats like olive oil and those in avocados and nuts are healthy and extra satiating.  Use it to lose it. Aim to incorporate each of the three Ps into every meal and snack. People who eat protein throughout the day are able to keep weight off, according to a study in the American Journal of Clinical Nutrition. In addition to meat, poultry and seafood, good sources are beans, lentils, eggs, tofu, and yogurt. As for fat, keep portion sizes in check by measuring out salad dressing, oil, and nut butters (shoot for one to two tablespoons). Finally, eat veggies or a little fruit at every meal. People who did that consumed 308 fewer calories but didn't feel any hungrier than when they didn't eat more produce.  7. How You Eat Is As Important As What You Eat In order for your brain to register that you're full, you need to focus on what you're eating. Sit down whenever you eat, preferably at a table. Turn off the TV or computer, put down your phone, and look at your food. Smell it. Chew slowly, and don't put another bite on your fork until you swallow. When women ate lunch this attentively, they consumed 30 percent less when snacking later than those who listened to an audiobook at lunchtime, according to a study in the [place] Journal of Nutrition. 8. Weighing Yourself Really Works The scale provides the best evidence about whether your efforts are paying off. Seeing the numbers tick up or down or stagnate is motivation to keep going-or to rethink your approach. A 2015 study at [facility] found that daily weigh-ins helped people lose more weight, keep it off, and maintain that loss, even after two years. Use it to lose it. Step on the scale at the same time every day for the  best results. If your weight shoots up several pounds from one weigh-in to the next, don't freak out. Eating a lot of salt the night  before or having your period is the likely culprit. The number should return to normal in a day or two. It's a steady climb that you need to do something about. 9. Too Much Stress and Too Little Sleep Are Your Enemies When you're tired and frazzled, your body cranks up the production of cortisol, the stress hormone that can cause carb cravings. Not getting enough sleep also boosts your levels of ghrelin, a hormone associated with hunger, while suppressing leptin, a hormone that signals fullness and satiety. People on a diet who slept only five and a half hours a night for two weeks lost 55 percent less fat and were hungrier than those who slept eight and a half hours, according to a study in the [place] [employer] Journal. Use it to lose it. Prioritize sleep, aiming for seven hours or more a night, which research shows helps lower stress. And make sure you're getting quality zzz's. If a snoring spouse or a fidgety cat wakes you up frequently throughout the night, you may end up getting the equivalent of just four hours of sleep, according to a study from [facility]. Keep pets out of the bedroom, and use a white-noise app to drown out snoring. 10. You Will Hit a plateau-And You Can Bust Through It As you slim down, your body releases much less leptin, the fullness hormone.  If you're not strength training, start right now. Building muscle can raise your metabolism to help you overcome a plateau. To keep your body challenged and burning calories, incorporate new moves and more intense intervals into your workouts or add another sweat session to your weekly routine. Alternatively, cut an extra 100 calories or so a day from your diet. Now that you've lost weight, your body simply doesn't need as much fuel.

## 2017-06-13 IMAGING — US US ABDOMEN LIMITED
1 series · 14 of 25 positions shown · non-contrast
Comparison: 04/15/2014 CT

CLINICAL DATA: Right upper quadrant abdominal pain

EXAM:
US ABDOMEN LIMITED - RIGHT UPPER QUADRANT

[Series 1: us abdomen limited · 0.20mm/px · 14 of 35 slices shown]
[im 1/35]
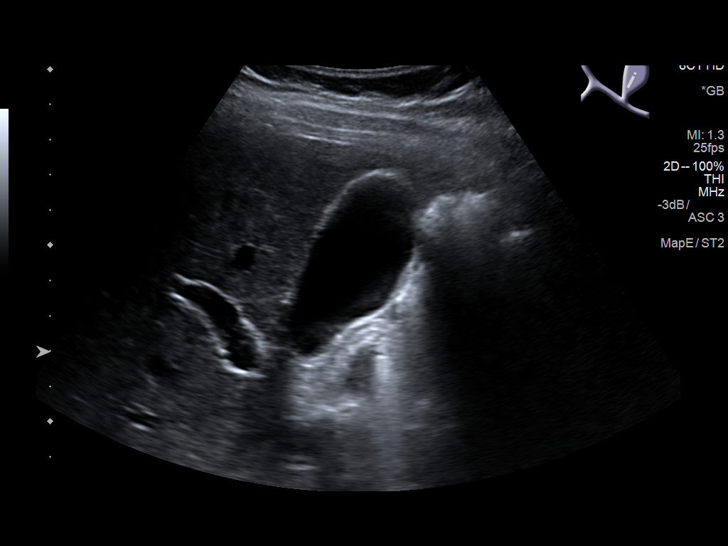
[im 3/35]
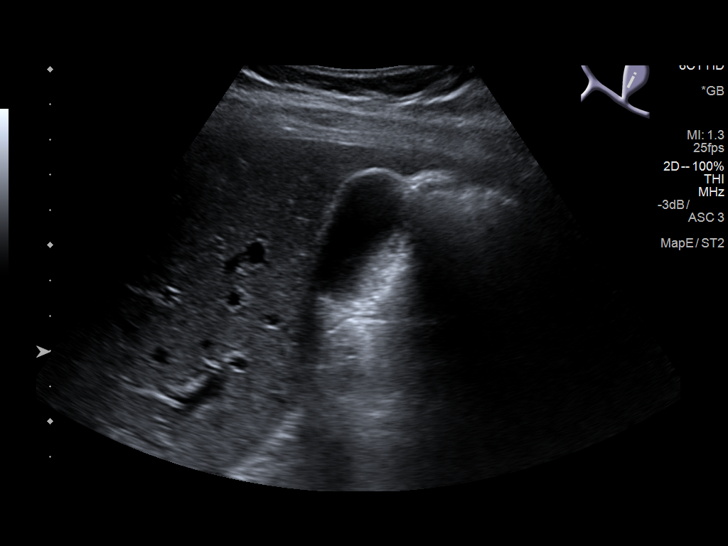
[im 6/35]
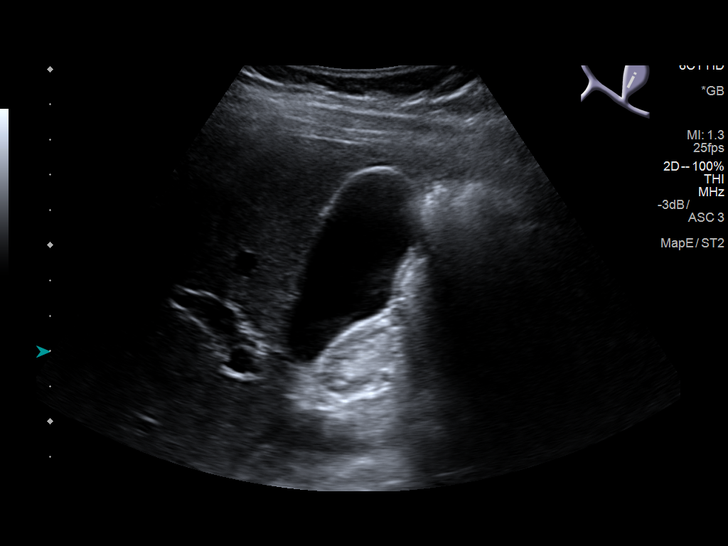
[im 9/35]
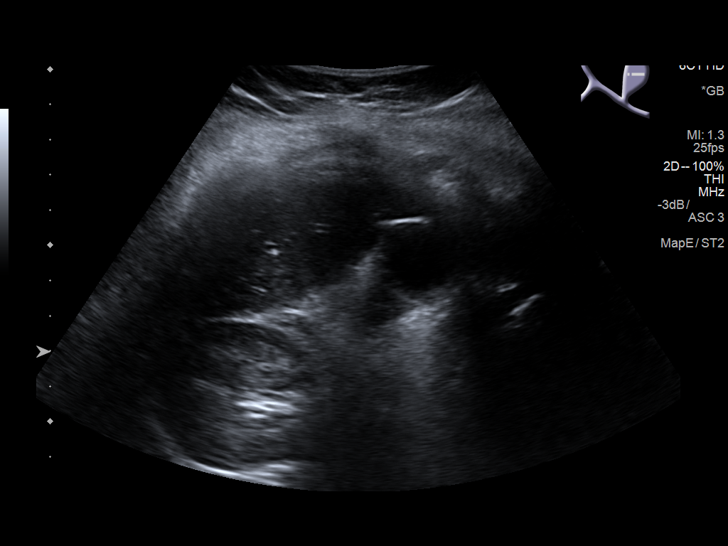
[im 12/35]
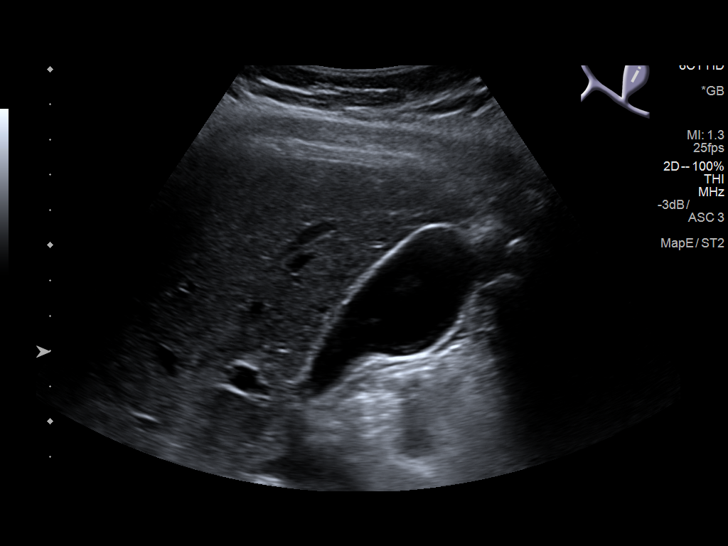
[im 13/35]
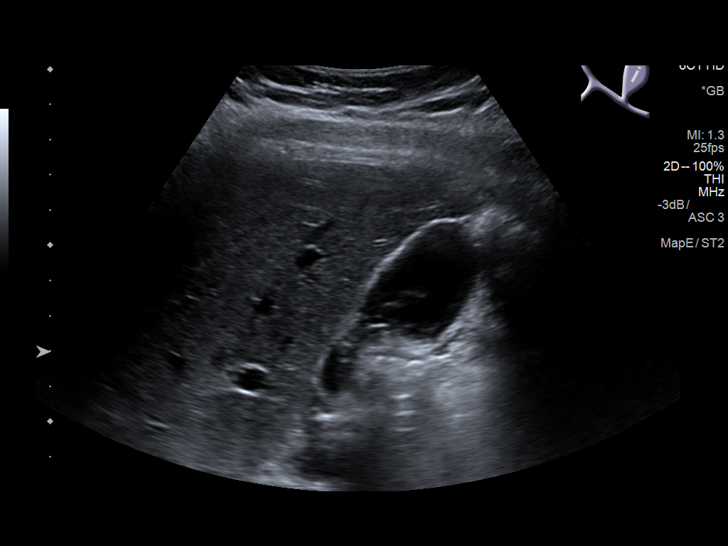
[im 16/35]
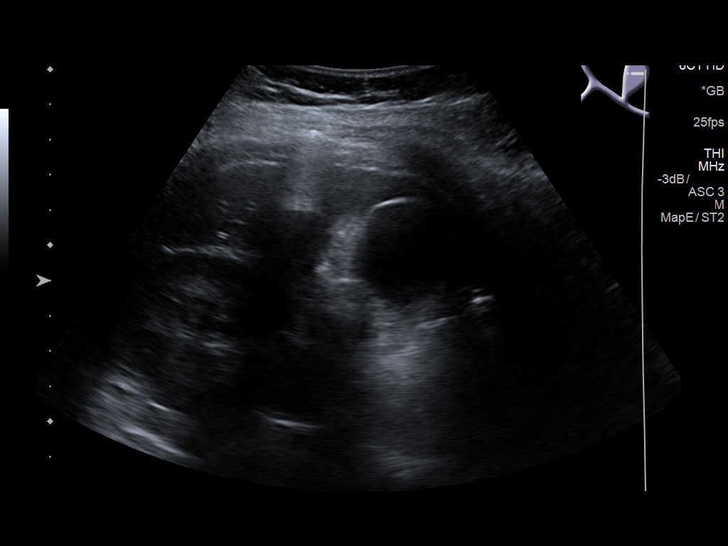
[im 19/35]
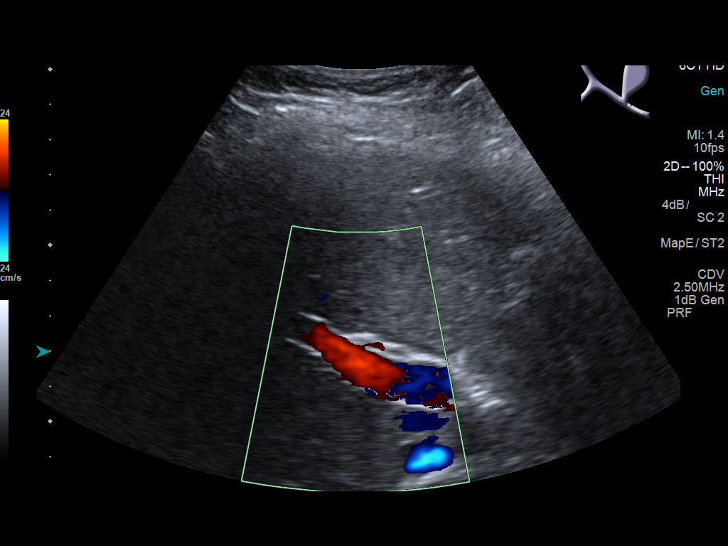
[im 22/35]
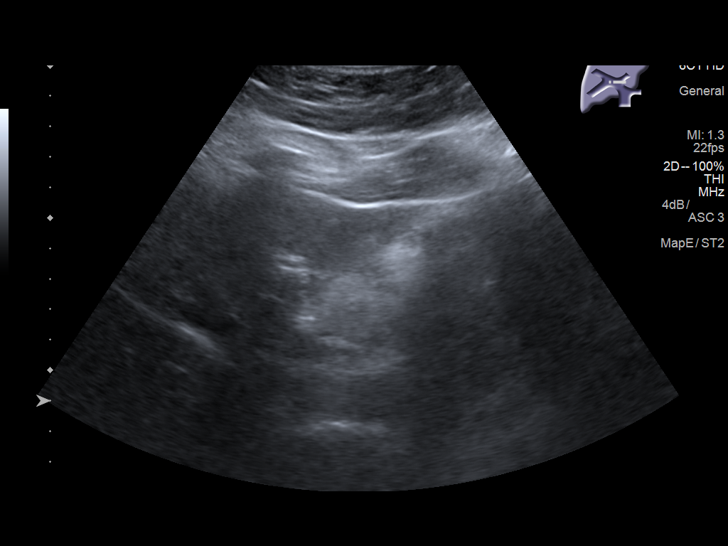
[im 23/35]
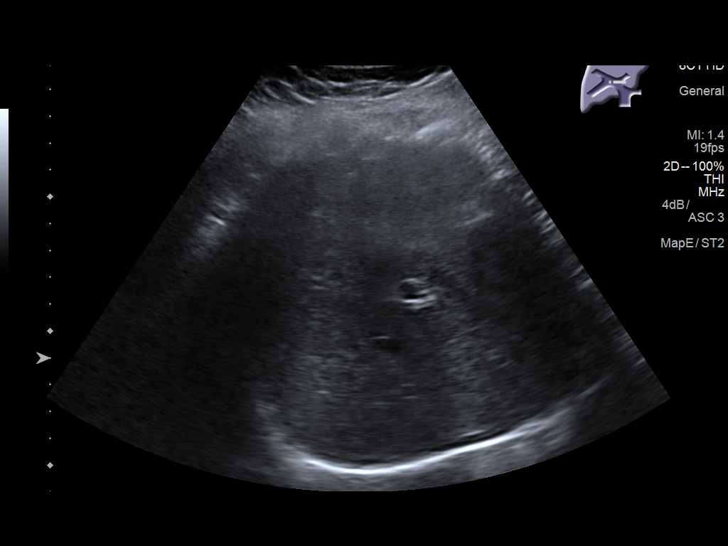
[im 26/35]
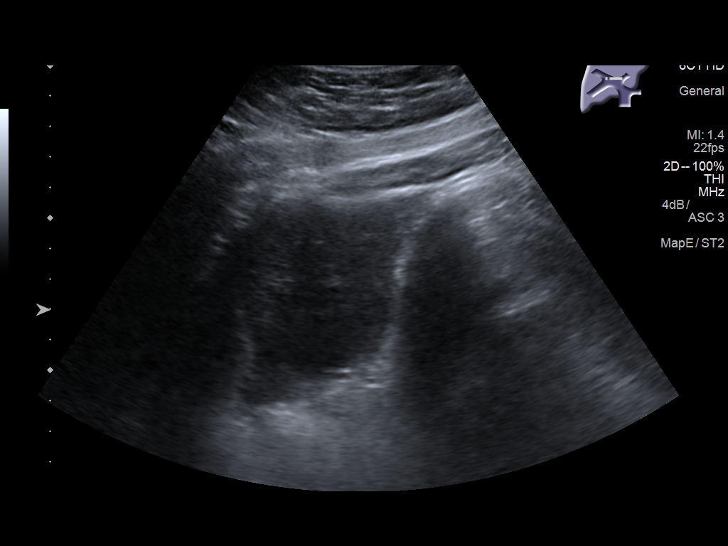
[im 29/35]
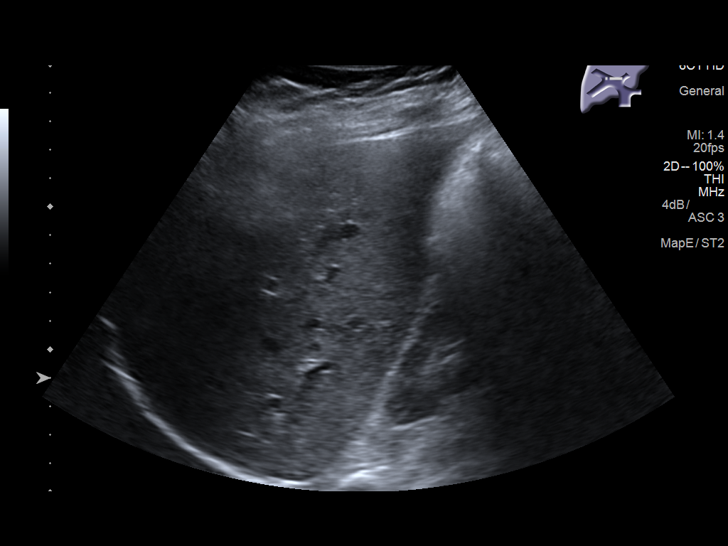
[im 32/35]
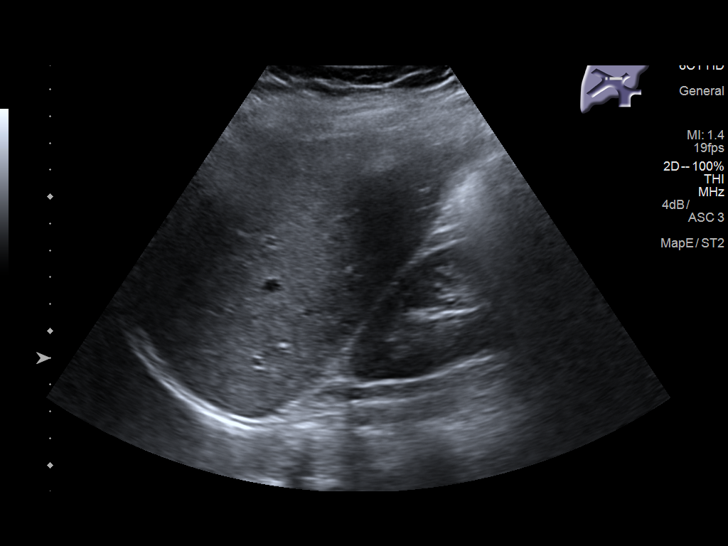
[im 35/35]
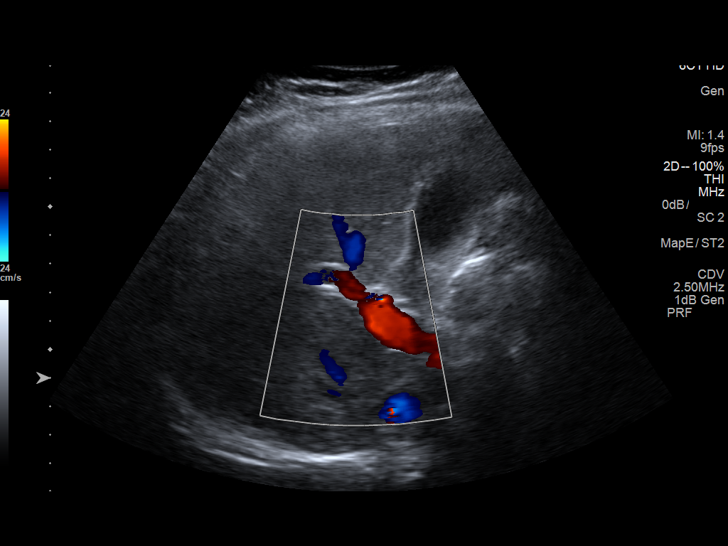

[14 of 25 positions shown; findings below may reference images not displayed]

FINDINGS: Gallbladder:

No gallstones or wall thickening visualized. There may be some
minimal dependent biliary sludge. No sonographic Murphy sign noted
by sonographer.

Common bile duct:

Diameter: 2.5 mm

Liver:

No focal lesion identified. Within normal limits in parenchymal
echogenicity.
IMPRESSION: No evidence cholecystitis. There may be some minimal dependent
biliary sludge within the gallbladder on some of the images
provided.

## 2017-06-20 NOTE — Progress Notes (Deleted)
Complete Physical  Assessment and Plan:  Constipation, unspecified constipation type - TSH   Acne, unspecified acne type Following derm   Vitamin D deficiency - VITAMIN D 25 Hydroxy (Vit-D Deficiency, Fractures)  Medication management - CBC with Differential/Platelet - BASIC METABOLIC PANEL WITH GFR - Hepatic function panel - Magnesium  Screening for diabetes mellitus - Hemoglobin A1c - Insulin, fasting   Screening cholesterol level - Lipid panel   Screening for blood or protein in urine - Urinalysis, Routine w reflex microscopic (not at [facility]) - Microalbumin / creatinine urine ratio   Routine general medical examination at a health care facility  Anemia, unspecified anemia type - Iron and TIBC - Ferritin - Vitamin B12   Discussed med's effects and SE's. Screening labs and tests as requested with regular follow-up as recommended.  HPI 21 y.o. female  presents for a complete physical. Her blood pressure has been controlled at home, today their BP is   She has history of constipation and has had evaluation by [facility] peds GI in the past, had normal evaluation, is doing better with diet.  Has graduated.  She is sexually active, one partner, on BCP from derm because starting acutane in a few weeks.  BMI is There is no height or weight on file to calculate BMI. Was put on phentermine last visit and had a LONG discussion about diet.  Wt Readings from Last 3 Encounters:  04/26/17 161 lb 9.6 oz (73.3 kg) (89 %, Z= 1.22)*  04/08/17 170 lb 9.6 oz (77.4 kg) (92 %, Z= 1.43)*  02/22/16 174 lb 12.8 oz (79.3 kg) (94 %, Z= 1.59)*   * Growth percentiles are based on CDC 2-20 Years data.   The cholesterol last visit was:   Lab Results  Component Value Date   CHOL 157 02/22/2016   HDL 61 02/22/2016   LDLCALC 76 02/22/2016   TRIG 101 02/22/2016   CHOLHDL 2.6 02/22/2016     Last A1C in the office was:  Lab Results  Component Value Date   HGBA1C 5.2 02/22/2016   Patient is on  Vitamin D supplement.   Lab Results  Component Value Date   VD25OH 26 (L) 02/22/2016       Current Medications:  Current Outpatient Prescriptions on File Prior to Visit  Medication Sig Dispense Refill  . naproxen (NAPROSYN) 250 MG tablet Take 1 tablet (250 mg total) by mouth 2 (two) times daily with a meal. 30 tablet 0  . phentermine (ADIPEX-P) 37.5 MG tablet Take 1 tablet (37.5 mg total) by mouth daily before breakfast. 30 tablet 1  . TRI-LO-ESTARYLLA 0.18/0.215/0.25 MG-25 MCG tab      No current facility-administered medications on file prior to visit.    Health Maintenance:   Immunization History  Administered Date(s) Administered  . DTaP 04/15/1998, 06/17/1998, 08/19/1998, 06/01/1999, 07/09/2003  . HPV Quadrivalent 05/30/2007, 07/31/2007, 09/01/2008  . Hepatitis A 05/30/2007, 09/01/2008  . Hepatitis B 1996-01-03, 04/15/1998, 11/18/1998  . HiB (PRP-OMP) 04/15/1998, 06/17/1998, 06/01/1999  . IPV 04/15/1998, 06/17/1998, 02/17/1999, 07/09/2003  . Influenza Nasal 09/30/2009, 10/10/2010  . MMR 02/17/1999, 07/09/2003  . Meningococcal Conjugate 07/01/2009  . Rotavirus Pentavalent 04/15/1998  . Tdap 09/01/2008  . Varicella 02/17/1999   TDAP 2009 HPV vaccine x 3 2009 Influenza declines  PAP N/A until 23 Sexually active, on BCP due to derm starting acutane, offered STD testing.  Declines STD testing.  MGM N/a  Medical History:  Past Medical History:  Diagnosis Date  . Amblyopia   .  Farsightedness    Allergies No Known Allergies  SURGICAL HISTORY She  has no past surgical history on file. FAMILY HISTORY Her family history includes Heart disease in her father; Hypertension in her father. SOCIAL HISTORY She  reports that she has been smoking.  She has never used smokeless tobacco. She reports that she drinks alcohol. She reports that she does not use drugs.   Review of Systems  Constitutional: Negative.   HENT: Negative.   Eyes: Negative.   Respiratory: Negative.    Cardiovascular: Negative.   Gastrointestinal: Negative.   Genitourinary: Negative.   Musculoskeletal: Negative.   Skin: Negative.        + acne  Neurological: Negative.   Endo/Heme/Allergies: Negative.   Psychiatric/Behavioral: Negative.     Physical Exam: Estimated body mass index is 26.08 kg/m as calculated from the following:   Height as of 04/26/17: 5' 6" (1.676 m).   Weight as of 04/26/17: 161 lb 9.6 oz (73.3 kg). There were no vitals taken for this visit. General Appearance: Well nourished, in no apparent distress. Eyes: PERRLA, EOMs, conjunctiva no swelling or erythema, normal fundi and vessels. Sinuses: No Frontal/maxillary tenderness ENT/Mouth: Ext aud canals clear, normal light reflex with TMs without erythema, bulging.  Good dentition. No erythema, swelling, or exudate on post pharynx. Tonsils not swollen or erythematous. Hearing normal.  Neck: Supple, thyroid normal. No bruits Respiratory: Respiratory effort normal, BS equal bilaterally without rales, rhonchi, wheezing or stridor. Cardio: RRR without murmurs, rubs or gallops. Brisk peripheral pulses without edema.  Chest: symmetric, with normal excursions and percussion. Abdomen: soft, normal BS, non tender without masses, hernias.  Lymphatics: Non tender without lymphadenopathy.  Musculoskeletal: Full ROM all peripheral extremities,5/5 strength, and normal gait. Skin: Warm, dry without rashes, lesions, ecchymosis.  Neuro: Cranial nerves intact, reflexes equal bilaterally. Normal muscle tone, no cerebellar symptoms. Sensation intact.  Psych: Awake and oriented X 3, normal affect, Insight and Judgment appropriate.    Vicie Mutters 7:21 AM

## 2017-06-21 ENCOUNTER — Encounter: Payer: Self-pay | Admitting: Physician Assistant

## 2017-08-12 DIAGNOSIS — N898 Other specified noninflammatory disorders of vagina: Secondary | ICD-10-CM | POA: Diagnosis not present

## 2017-08-12 DIAGNOSIS — N760 Acute vaginitis: Secondary | ICD-10-CM | POA: Diagnosis not present

## 2017-12-23 DIAGNOSIS — K011 Impacted teeth: Secondary | ICD-10-CM | POA: Diagnosis not present

## 2018-01-27 ENCOUNTER — Ambulatory Visit (INDEPENDENT_AMBULATORY_CARE_PROVIDER_SITE_OTHER): Payer: BLUE CROSS/BLUE SHIELD | Admitting: Physician Assistant

## 2018-01-27 ENCOUNTER — Encounter: Payer: Self-pay | Admitting: Physician Assistant

## 2018-01-27 VITALS — BP 118/76 | HR 93 | Temp 97.6°F | Resp 16 | Ht 67.0 in | Wt 156.8 lb

## 2018-01-27 DIAGNOSIS — Z0000 Encounter for general adult medical examination without abnormal findings: Secondary | ICD-10-CM

## 2018-01-27 DIAGNOSIS — Z13220 Encounter for screening for lipoid disorders: Secondary | ICD-10-CM

## 2018-01-27 DIAGNOSIS — Z1389 Encounter for screening for other disorder: Secondary | ICD-10-CM | POA: Diagnosis not present

## 2018-01-27 DIAGNOSIS — Z6824 Body mass index (BMI) 24.0-24.9, adult: Secondary | ICD-10-CM

## 2018-01-27 DIAGNOSIS — Z130 Encounter for screening for diseases of the blood and blood-forming organs and certain disorders involving the immune mechanism: Secondary | ICD-10-CM | POA: Diagnosis not present

## 2018-01-27 DIAGNOSIS — E559 Vitamin D deficiency, unspecified: Secondary | ICD-10-CM

## 2018-01-27 DIAGNOSIS — Z1329 Encounter for screening for other suspected endocrine disorder: Secondary | ICD-10-CM | POA: Diagnosis not present

## 2018-01-27 DIAGNOSIS — Z79899 Other long term (current) drug therapy: Secondary | ICD-10-CM | POA: Diagnosis not present

## 2018-01-27 NOTE — Progress Notes (Signed)
Complete Physical  Assessment and Plan: BMI 24.0-24.9, adult Requested phentermine but with normal BMI, informed her that she does not need it, continue healthy eating, working out to tone.  -     TSH  Vitamin D deficiency -     TSH -     VITAMIN D 25 Hydroxy (Vit-D Deficiency, Fractures)  Medication management -     CBC with Differential/Platelet -     BASIC METABOLIC PANEL WITH GFR -     Hepatic function panel -     Magnesium  Routine general medical examination at a health care facility Need STD screening, declines today  Screening cholesterol level -     Lipid panel  Screening for blood or protein in urine -     Urinalysis, Routine w reflex microscopic -     Microalbumin / creatinine urine ratio  Screening, anemia, deficiency, iron -     Iron,Total/Total Iron Binding Cap -     Ferritin -     Vitamin B12   Discussed med's effects and SE's. Screening labs and tests as requested with regular follow-up as recommended. Over 40 minutes of exam, counseling, chart review, and complex, high level critical decision making was performed this visit.   HPI  22 y.o. female  presents for a complete physical and follow up for has Acne; Constipation; Vitamin D deficiency; and Medication management on their problem list..  She complains a month ago, had bruising on her legs but no where else. No nose bleeds, no heavy menses.  Her blood pressure has been controlled at home, today their BP is BP: 118/76 She does workout. She denies chest pain, shortness of breath, dizziness.   She is not on cholesterol medication and denies myalgias. Her cholesterol is not at goal. The cholesterol last visit was:   Lab Results  Component Value Date   CHOL 157 02/22/2016   HDL 61 02/22/2016   LDLCALC 76 02/22/2016   TRIG 101 02/22/2016   CHOLHDL 2.6 02/22/2016    Last A1C in the office was:  Lab Results  Component Value Date   HGBA1C 5.2 02/22/2016   Patient is on Vitamin D supplement.   Lab  Results  Component Value Date   VD25OH 26 (L) 02/22/2016     BMI is Body mass index is 24.56 kg/m., she is working on diet and exercise. Wt Readings from Last 3 Encounters:  01/27/18 156 lb 12.8 oz (71.1 kg) (85 %, Z= 1.04)*  04/26/17 161 lb 9.6 oz (73.3 kg) (89 %, Z= 1.22)*  04/08/17 170 lb 9.6 oz (77.4 kg) (92 %, Z= 1.43)*   * Growth percentiles are based on CDC (Girls, 2-20 Years) data.     Current Medications:  No current outpatient medications on file prior to visit.   No current facility-administered medications on file prior to visit.    Allergies:  No Known Allergies   Medical History:  She has Acne; Constipation; Vitamin D deficiency; and Medication management on their problem list.   Health Maintenance:   Immunization History  Administered Date(s) Administered  . DTaP 04/15/1998, 06/17/1998, 08/19/1998, 06/01/1999, 07/09/2003  . HPV Quadrivalent 05/30/2007, 07/31/2007, 09/01/2008  . Hepatitis A 05/30/2007, 09/01/2008  . Hepatitis B 02/14/1998, 04/15/1998, 11/18/1998  . HiB (PRP-OMP) 04/15/1998, 06/17/1998, 06/01/1999  . IPV 04/15/1998, 06/17/1998, 02/17/1999, 07/09/2003  . Influenza Nasal 09/30/2009, 10/10/2010  . MMR 02/17/1999, 07/09/2003  . Meningococcal Conjugate 07/01/2009  . Rotavirus Pentavalent 04/15/1998  . Tdap 09/01/2008  . Varicella 02/17/1999     Tetanus: states got 2-3 years ago Pneumovax: Prevnar 13:  Flu vaccine: declines Zostavax: HPV completed  Patient's last menstrual period was 12/18/2017. Pap: Never had pap smear, needs at 23 She is sexually active, declines testing today MGM:  DEXA: Colonoscopy: EGD:  Patient Care Team: McKeown, William, MD as PCP - General (Internal Medicine)  Surgical History:  She has no past surgical history on file. Family History:  Herfamily history includes Heart disease in her father; Hypertension in her father. Social History:  She reports that she has been smoking.  she has never used smokeless  tobacco. She reports that she drinks alcohol. She reports that she does not use drugs.  Review of Systems: ROS  Physical Exam: Estimated body mass index is 24.56 kg/m as calculated from the following:   Height as of this encounter: 5' 7" (1.702 m).   Weight as of this encounter: 156 lb 12.8 oz (71.1 kg). BP 118/76   Pulse 93   Temp 97.6 F (36.4 C)   Resp 16   Ht 5' 7" (1.702 m)   Wt 156 lb 12.8 oz (71.1 kg)   LMP 12/18/2017   SpO2 95%   BMI 24.56 kg/m  General Appearance: Well nourished, in no apparent distress.  Eyes: PERRLA, EOMs, conjunctiva no swelling or erythema, normal fundi and vessels.  Sinuses: No Frontal/maxillary tenderness  ENT/Mouth: Ext aud canals clear, normal light reflex with TMs without erythema, bulging. Good dentition. No erythema, swelling, or exudate on post pharynx. Tonsils not swollen or erythematous. Hearing normal.  Neck: Supple, thyroid normal. No bruits  Respiratory: Respiratory effort normal, BS equal bilaterally without rales, rhonchi, wheezing or stridor.  Cardio: RRR without murmurs, rubs or gallops. Brisk peripheral pulses without edema.  Chest: symmetric, with normal excursions and percussion.  Breasts: Symmetric, without lumps, nipple discharge, retractions.  Abdomen: Soft, nontender, no guarding, rebound, hernias, masses, or organomegaly.  Lymphatics: Non tender without lymphadenopathy.  Genitourinary:  Musculoskeletal: Full ROM all peripheral extremities,5/5 strength, and normal gait.  Skin: Warm, dry without rashes, lesions, ecchymosis. Neuro: Cranial nerves intact, reflexes equal bilaterally. Normal muscle tone, no cerebellar symptoms. Sensation intact.  Psych: Awake and oriented X 3, normal affect, Insight and Judgment appropriate.   EKG: WNL no ST changes. AORTA SCAN: WNL   Amanda Collier 3:40 PM [facility] 

## 2018-01-27 NOTE — Patient Instructions (Signed)
Use a dropper or use a cap to put peroxide, olive oil,mineral oil or canola oil in the effected ear- 2-3 times a week. Let it soak for 20-30 min then you can take a shower or use a baby bulb with warm water to wash out the ear wax.  Do not use Qtips  Try the melatonin 5mg -20mg  dissolvable or gummy 30 mins before bed  11 Tips to Follow:  1. No caffeine after 3pm: Avoid beverages with caffeine (soda, tea, energy drinks, etc.) especially after 3pm. 2. Don't go to bed hungry: Have your evening meal at least 3 hrs. before going to sleep. It's fine to have a small bedtime snack such as a glass of milk and a few crackers but don't have a big meal. 3. Have a nightly routine before bed: Plan on "winding down" before you go to sleep. Begin relaxing about 1 hour before you go to bed. Try doing a quiet activity such as listening to calming music, reading a book or meditating. 4. Turn off the TV and ALL electronics including video games, tablets, laptops, etc. 1 hour before sleep, and keep them out of the bedroom. 5. Turn off your cell phone and all notifications (new email and text alerts) or even better, leave your phone outside your room while you sleep. Studies have shown that a part of your brain continues to respond to certain lights and sounds even while you're still asleep. 6. Make your bedroom quiet, dark and cool. If you can't control the noise, try wearing earplugs or using a fan to block out other sounds. 7. Practice relaxation techniques. Try reading a book or meditating or drain your brain by writing a list of what you need to do the next day. 8. Don't nap unless you feel sick: you'll have a better night's sleep. 9. Don't smoke, or quit if you do. Nicotine, alcohol, and marijuana can all keep you awake. Talk to your health care provider if you need help with substance use. 10. Most importantly, wake up at the same time every day (or within 1 hour of your usual wake up time) EVEN on the weekends. A  regular wake up time promotes sleep hygiene and prevents sleep problems. 11. Reduce exposure to bright light in the last three hours of the day before going to sleep. Maintaining good sleep hygiene and having good sleep habits lower your risk of developing sleep problems. Getting better sleep can also improve your concentration and alertness. Try the simple steps in this guide. If you still have trouble getting enough rest, make an appointment with your health care provider.   8 Critical Weight-Loss Tips That Aren't Diet and Exercise  1. STARVE THE DISTRACTIONS  All too often when we eat, we're also multitasking: watching TV, answering emails, scrolling through social media. These habits are detrimental to having a strong, clear, healthy relationship with food, and they can hinder our ability to make dietary changes.  In order to truly focus on what you're eating, how much you're eating, why you're eating those specific foods and, most importantly, how those foods make you feel, you need to starve the distractions. That means when you eat, just eat. Focus on your food, the process it went through to end up on your plate, where it came from and how it nourishes you. With this technique, you're more likely to finish a meal feeling satiated.  2.  CONSIDER WHAT YOU'RE NOT WILLING TO DO  This might sound counterintuitive, but it  can help provide a "why" when motivation is waning. Declare, in writing, what you are unwilling to do, for example "I am unwilling to be the old dad who cannot play sports with my children".  So consider what you're not willing to accept, write it down, and keep it at the ready.  3.  STOP LABELING FOOD "GOOD" AND "BAD"  You've probably heard someone say they ate something "bad." Maybe you've even said it yourself.  The trouble with 'bad' foods isn't that they'll send you to the grave after a bite or two. The trouble comes when we eat excessive portions of really  calorie-dense foods meal after meal, day after day.  Instead of labeling foods as good or bad, think about which foods you can eat a lot of, and which ones you should just eat a little of. Then, plan ways to eat the foods you really like in portions that fit with your overall goals. A good example of this would be having a slice of pizza alongside a club salad with chicken breast, avocado and a bit of dressing. This is vastly different than 3 slices of pizza, 4 breadsticks with cheese sauce and half of a liter of regular soda.  4.  BRUSH YOUR TEETH AFTER YOU EAT  Getting your mindset in order is important, but sometimes small habits can make a big difference. After eating, you still have the taste of food in their mouth, which often causes people to eat more even if they are full or engage in a nibble or two of dessert.  Brushing your teeth will remove the taste of food from your mouth, and the clean, minty freshness will serve as a cue that mealtime is over.  5.  FOCUS ON CROWDING NOT CUTTING  The most common first step during 'dieting' is to cut. We cut our portion sizes down, we cut out 'bad' foods, we cut out entire food groups. This act of cutting puts us and our minds into scarcity mode.  When something is off-limits, even if you're able to avoid it for a while, you could end up bingeing on it later because you've gone so long without it. So, instead of cutting, focus on crowding. If you crowd your plate and fill it up with more foods like veggies and protein, it simply allows less room for the other stuff. In other words, shift your focus away from what you can't eat, and celebrate the foods that will help you reach your goals.  6.  TAKE TRACKING A STEP FURTHER  Track what you eat, when you ate it, how much you ate and how that food made you feel. Being completely honest with yourself and writing down every single thing that passes through your lips will help you start to notice that maybe you  actually do snack, possibly take in more sugar than you thought, eat when you're bored rather than just hungry or maybe that you have a habit of snacking before bed while watching TV.  The difference from simply tracking your food intake is you're taking into account how food makes you feel, as well as what you're doing while you're eating. This is about becoming more mindful of what, when and why you eat.  7.  PRIORITIZE GOOD SLEEP  One of the strongest risk factors for being overweight is poor sleep. When you're feeling tired, you're more likely to choose unhealthy comfort foods and to skip your workout. Additionally, sleep deprivation may slow down your  metabolism. Burnett Kanaris! Therefore, sleeping 7-8 hours per night can help with weight loss without having to change your diet or increase your physical activity. And if you feel you snore and still wake up tired, talk with me about sleep apnea.  8.  SET ASIDE TIME TO DISCONNECT  Just get out there. Disconnect from the electronics and connect to the elements. Not only will this help reduce stress (a major factor in weight gain) by giving your mind a break from the constant stimulation we've all become so accustomed to, but it may also reprogram your brain to connect with yourself and what you're feeling.  Veggies are great because you can eat a ton! They are low in calories, great to fill you up, and have a ton of vitamins, minerals, and protein.

## 2018-01-28 LAB — HEPATIC FUNCTION PANEL
AG RATIO: 1.6 (calc) (ref 1.0–2.5)
ALBUMIN MSPROF: 4.7 g/dL (ref 3.6–5.1)
ALT: 12 U/L (ref 5–32)
AST: 18 U/L (ref 12–32)
Alkaline phosphatase (APISO): 69 U/L (ref 47–176)
Bilirubin, Direct: 0.2 mg/dL (ref 0.0–0.2)
GLOBULIN: 3 g/dL (ref 2.0–3.8)
Indirect Bilirubin: 0.7 mg/dL (calc) (ref 0.2–1.1)
TOTAL PROTEIN: 7.7 g/dL (ref 6.3–8.2)
Total Bilirubin: 0.9 mg/dL (ref 0.2–1.1)

## 2018-01-28 LAB — CBC WITH DIFFERENTIAL/PLATELET
BASOS PCT: 0.5 %
Basophils Absolute: 46 cells/uL (ref 0–200)
EOS ABS: 64 {cells}/uL (ref 15–500)
Eosinophils Relative: 0.7 %
HCT: 43.2 % (ref 35.0–45.0)
Hemoglobin: 14.6 g/dL (ref 11.7–15.5)
LYMPHS ABS: 2006 {cells}/uL (ref 850–3900)
MCH: 31.2 pg (ref 27.0–33.0)
MCHC: 33.8 g/dL (ref 32.0–36.0)
MCV: 92.3 fL (ref 80.0–100.0)
MPV: 9.8 fL (ref 7.5–12.5)
Monocytes Relative: 8.5 %
Neutro Abs: 6302 cells/uL (ref 1500–7800)
Neutrophils Relative %: 68.5 %
Platelets: 265 10*3/uL (ref 140–400)
RBC: 4.68 10*6/uL (ref 3.80–5.10)
RDW: 12.1 % (ref 11.0–15.0)
Total Lymphocyte: 21.8 %
WBC: 9.2 10*3/uL (ref 3.8–10.8)
WBCMIX: 782 {cells}/uL (ref 200–950)

## 2018-01-28 LAB — LIPID PANEL
CHOL/HDL RATIO: 2.2 (calc) (ref <5.0)
CHOLESTEROL: 140 mg/dL (ref <170)
HDL: 64 mg/dL (ref >45)
LDL CHOLESTEROL (CALC): 58 mg/dL (ref <110)
NON-HDL CHOLESTEROL (CALC): 76 mg/dL (ref <120)
Triglycerides: 99 mg/dL — ABNORMAL HIGH (ref <90)

## 2018-01-28 LAB — MAGNESIUM: Magnesium: 2.2 mg/dL (ref 1.5–2.5)

## 2018-01-28 LAB — URINALYSIS, ROUTINE W REFLEX MICROSCOPIC
BACTERIA UA: NONE SEEN /HPF
Bilirubin Urine: NEGATIVE
Glucose, UA: NEGATIVE
HGB URINE DIPSTICK: NEGATIVE
Nitrite: NEGATIVE
PH: 6 (ref 5.0–8.0)
Protein, ur: NEGATIVE
RBC / HPF: NONE SEEN /HPF (ref 0–2)
Specific Gravity, Urine: 1.025 (ref 1.001–1.03)

## 2018-01-28 LAB — BASIC METABOLIC PANEL WITH GFR
BUN: 8 mg/dL (ref 7–20)
CALCIUM: 9.9 mg/dL (ref 8.9–10.4)
CHLORIDE: 103 mmol/L (ref 98–110)
CO2: 27 mmol/L (ref 20–32)
CREATININE: 0.71 mg/dL (ref 0.50–1.00)
GFR, Est African American: 143 mL/min/{1.73_m2} (ref >=60)
GFR, Est Non African American: 123 mL/min/{1.73_m2} (ref >=60)
GLUCOSE: 84 mg/dL (ref 65–99)
Potassium: 3.8 mmol/L (ref 3.8–5.1)
Sodium: 141 mmol/L (ref 135–146)

## 2018-01-28 LAB — VITAMIN D 25 HYDROXY (VIT D DEFICIENCY, FRACTURES): Vit D, 25-Hydroxy: 38 ng/mL (ref 30–100)

## 2018-01-28 LAB — VITAMIN B12: Vitamin B-12: 284 pg/mL (ref 200–1100)

## 2018-01-28 LAB — MICROALBUMIN / CREATININE URINE RATIO
Creatinine, Urine: 248 mg/dL (ref 20–275)
MICROALB UR: 1.4 mg/dL
Microalb Creat Ratio: 6 mcg/mg creat (ref <30)

## 2018-01-28 LAB — TSH: TSH: 0.8 mIU/L

## 2018-01-28 LAB — IRON, TOTAL/TOTAL IRON BINDING CAP
%SAT: 37 % (ref 8–45)
Iron: 136 ug/dL (ref 27–164)
TIBC: 367 mcg/dL (calc) (ref 271–448)

## 2018-01-28 LAB — FERRITIN: FERRITIN: 80 ng/mL — AB (ref 6–67)

## 2018-03-11 ENCOUNTER — Encounter: Payer: Self-pay | Admitting: Physician Assistant

## 2018-06-27 ENCOUNTER — Encounter: Payer: Self-pay | Admitting: Physician Assistant

## 2018-09-30 DIAGNOSIS — Z3490 Encounter for supervision of normal pregnancy, unspecified, unspecified trimester: Secondary | ICD-10-CM | POA: Diagnosis not present

## 2018-10-01 DIAGNOSIS — O3680X0 Pregnancy with inconclusive fetal viability, not applicable or unspecified: Secondary | ICD-10-CM | POA: Diagnosis not present

## 2018-10-01 DIAGNOSIS — Z3A08 8 weeks gestation of pregnancy: Secondary | ICD-10-CM | POA: Diagnosis not present

## 2018-10-01 DIAGNOSIS — Z3490 Encounter for supervision of normal pregnancy, unspecified, unspecified trimester: Secondary | ICD-10-CM | POA: Diagnosis not present

## 2018-11-04 DIAGNOSIS — Z3A12 12 weeks gestation of pregnancy: Secondary | ICD-10-CM | POA: Diagnosis not present

## 2018-11-04 DIAGNOSIS — Z3682 Encounter for antenatal screening for nuchal translucency: Secondary | ICD-10-CM | POA: Diagnosis not present

## 2018-12-18 DIAGNOSIS — Z363 Encounter for antenatal screening for malformations: Secondary | ICD-10-CM | POA: Diagnosis not present

## 2018-12-18 DIAGNOSIS — Z36 Encounter for antenatal screening for chromosomal anomalies: Secondary | ICD-10-CM | POA: Diagnosis not present

## 2018-12-18 DIAGNOSIS — Z3A19 19 weeks gestation of pregnancy: Secondary | ICD-10-CM | POA: Diagnosis not present

## 2018-12-19 DIAGNOSIS — Z3A19 19 weeks gestation of pregnancy: Secondary | ICD-10-CM | POA: Diagnosis not present

## 2018-12-19 DIAGNOSIS — O09292 Supervision of pregnancy with other poor reproductive or obstetric history, second trimester: Secondary | ICD-10-CM | POA: Diagnosis not present

## 2018-12-19 DIAGNOSIS — Z3A18 18 weeks gestation of pregnancy: Secondary | ICD-10-CM | POA: Diagnosis not present

## 2019-02-04 NOTE — Progress Notes (Deleted)
Complete Physical  Assessment and Plan: BMI 24.0-24.9, adult Requested phentermine but with normal BMI, informed her that she does not need it, continue healthy eating, working out to tone.  -     TSH  Vitamin D deficiency -     TSH -     VITAMIN D 25 Hydroxy (Vit-D Deficiency, Fractures)  Medication management -     CBC with Differential/Platelet -     BASIC METABOLIC PANEL WITH GFR -     Hepatic function panel -     Magnesium  Routine general medical examination at a health care facility Need STD screening, declines today  Screening cholesterol level -     Lipid panel  Screening for blood or protein in urine -     Urinalysis, Routine w reflex microscopic -     Microalbumin / creatinine urine ratio  Screening, anemia, deficiency, iron -     Iron,Total/Total Iron Binding Cap -     Ferritin -     Vitamin B12   Discussed med's effects and SE's. Screening labs and tests as requested with regular follow-up as recommended. Over 40 minutes of exam, counseling, chart review, and complex, high level critical decision making was performed this visit.   HPI  23 y.o. female  presents for a complete physical and follow up for has Acne; Constipation; Vitamin D deficiency; and Medication management on their problem list..  She complains a month ago, had bruising on her legs but no where else. No nose bleeds, no heavy menses.  Her blood pressure has been controlled at home, today their BP is   She does workout. She denies chest pain, shortness of breath, dizziness.   She is not on cholesterol medication and denies myalgias. Her cholesterol is not at goal. The cholesterol last visit was:   Lab Results  Component Value Date   CHOL 140 01/27/2018   HDL 64 01/27/2018   LDLCALC 58 01/27/2018   TRIG 99 (H) 01/27/2018   CHOLHDL 2.2 01/27/2018    Last A1C in the office was:  Lab Results  Component Value Date   HGBA1C 5.2 02/22/2016   Patient is on Vitamin D supplement.   Lab  Results  Component Value Date   VD25OH 38 01/27/2018     BMI is There is no height or weight on file to calculate BMI., she is working on diet and exercise. Wt Readings from Last 3 Encounters:  01/27/18 156 lb 12.8 oz (71.1 kg) (85 %, Z= 1.04)*  04/26/17 161 lb 9.6 oz (73.3 kg) (89 %, Z= 1.22)*  04/08/17 170 lb 9.6 oz (77.4 kg) (92 %, Z= 1.43)*   * Growth percentiles are based on CDC (Girls, 2-20 Years) data.     Current Medications:  No current outpatient medications on file prior to visit.   No current facility-administered medications on file prior to visit.    Allergies:  No Known Allergies   Medical History:  She has Acne; Constipation; Vitamin D deficiency; and Medication management on their problem list.   Health Maintenance:   Immunization History  Administered Date(s) Administered  . DTaP 04/15/1998, 06/17/1998, 08/19/1998, 06/01/1999, 07/09/2003  . HPV Quadrivalent 05/30/2007, 07/31/2007, 09/01/2008  . Hepatitis A 05/30/2007, 09/01/2008  . Hepatitis B 05-03-98, 04/15/1998, 11/18/1998  . HiB (PRP-OMP) 04/15/1998, 06/17/1998, 06/01/1999  . IPV 04/15/1998, 06/17/1998, 02/17/1999, 07/09/2003  . Influenza Nasal 09/30/2009, 10/10/2010  . MMR 02/17/1999, 07/09/2003  . Meningococcal Conjugate 07/01/2009  . Rotavirus Pentavalent 04/15/1998  . Tdap 09/01/2008  .  Varicella 02/17/1999   Tetanus: states got 2-3 years ago Pneumovax: Prevnar 13:  Flu vaccine: declines Zostavax: HPV completed  No LMP recorded. Pap: Never had pap smear, needs at 23 She is sexually active, declines testing today MGM:  DEXA: Colonoscopy: EGD:  Patient Care Team: Unk Pinto, MD as PCP - General (Internal Medicine)  Surgical History:  She has no past surgical history on file. Family History:  Herfamily history includes Heart disease in her father; Hypertension in her father. Social History:  She reports that she has been smoking. She has never used smokeless tobacco. She  reports current alcohol use. She reports that she does not use drugs.  Review of Systems: ROS  Physical Exam: Estimated body mass index is 24.56 kg/m as calculated from the following:   Height as of 01/27/18: _0  (1.702 m).   Weight as of 01/27/18: 156 lb 12.8 oz (71.1 kg). There were no vitals taken for this visit. General Appearance: Well nourished, in no apparent distress.  Eyes: PERRLA, EOMs, conjunctiva no swelling or erythema, normal fundi and vessels.  Sinuses: No Frontal/maxillary tenderness  ENT/Mouth: Ext aud canals clear, normal light reflex with TMs without erythema, bulging. Good dentition. No erythema, swelling, or exudate on post pharynx. Tonsils not swollen or erythematous. Hearing normal.  Neck: Supple, thyroid normal. No bruits  Respiratory: Respiratory effort normal, BS equal bilaterally without rales, rhonchi, wheezing or stridor.  Cardio: RRR without murmurs, rubs or gallops. Brisk peripheral pulses without edema.  Chest: symmetric, with normal excursions and percussion.  Breasts: Symmetric, without lumps, nipple discharge, retractions.  Abdomen: Soft, nontender, no guarding, rebound, hernias, masses, or organomegaly.  Lymphatics: Non tender without lymphadenopathy.  Genitourinary:  Musculoskeletal: Full ROM all peripheral extremities,5/5 strength, and normal gait.  Skin: Warm, dry without rashes, lesions, ecchymosis. Neuro: Cranial nerves intact, reflexes equal bilaterally. Normal muscle tone, no cerebellar symptoms. Sensation intact.  Psych: Awake and oriented X 3, normal affect, Insight and Judgment appropriate.   EKG: WNL no ST changes. AORTA SCAN: WNL   Vicie Mutters 1:54 PM [facility]

## 2019-02-05 ENCOUNTER — Encounter: Payer: Self-pay | Admitting: Physician Assistant

## 2019-02-11 DIAGNOSIS — D485 Neoplasm of uncertain behavior of skin: Secondary | ICD-10-CM | POA: Diagnosis not present

## 2019-02-18 DIAGNOSIS — Z3A28 28 weeks gestation of pregnancy: Secondary | ICD-10-CM | POA: Diagnosis not present

## 2019-02-18 DIAGNOSIS — Z113 Encounter for screening for infections with a predominantly sexual mode of transmission: Secondary | ICD-10-CM | POA: Diagnosis not present

## 2019-02-19 DIAGNOSIS — R7302 Impaired glucose tolerance (oral): Secondary | ICD-10-CM | POA: Diagnosis not present

## 2019-02-19 DIAGNOSIS — Z113 Encounter for screening for infections with a predominantly sexual mode of transmission: Secondary | ICD-10-CM | POA: Diagnosis not present

## 2019-02-19 DIAGNOSIS — Z3A28 28 weeks gestation of pregnancy: Secondary | ICD-10-CM | POA: Diagnosis not present

## 2019-02-25 DIAGNOSIS — Z3A00 Weeks of gestation of pregnancy not specified: Secondary | ICD-10-CM | POA: Diagnosis not present

## 2019-02-25 DIAGNOSIS — O24919 Unspecified diabetes mellitus in pregnancy, unspecified trimester: Secondary | ICD-10-CM | POA: Diagnosis not present

## 2019-04-16 DIAGNOSIS — Z3A36 36 weeks gestation of pregnancy: Secondary | ICD-10-CM | POA: Diagnosis not present

## 2019-05-11 DIAGNOSIS — H6123 Impacted cerumen, bilateral: Secondary | ICD-10-CM | POA: Diagnosis not present

## 2019-05-12 DIAGNOSIS — O9852 Other viral diseases complicating childbirth: Secondary | ICD-10-CM | POA: Diagnosis not present

## 2019-05-12 DIAGNOSIS — B009 Herpesviral infection, unspecified: Secondary | ICD-10-CM | POA: Diagnosis not present

## 2019-05-12 DIAGNOSIS — O99824 Streptococcus B carrier state complicating childbirth: Secondary | ICD-10-CM | POA: Diagnosis not present

## 2019-05-12 DIAGNOSIS — Z3A39 39 weeks gestation of pregnancy: Secondary | ICD-10-CM | POA: Diagnosis not present

## 2019-05-12 DIAGNOSIS — O24420 Gestational diabetes mellitus in childbirth, diet controlled: Secondary | ICD-10-CM | POA: Diagnosis not present

## 2019-05-12 DIAGNOSIS — B951 Streptococcus, group B, as the cause of diseases classified elsewhere: Secondary | ICD-10-CM | POA: Diagnosis not present

## 2019-05-12 DIAGNOSIS — O24429 Gestational diabetes mellitus in childbirth, unspecified control: Secondary | ICD-10-CM | POA: Diagnosis not present

## 2019-05-12 DIAGNOSIS — Z87891 Personal history of nicotine dependence: Secondary | ICD-10-CM | POA: Diagnosis not present

## 2019-05-18 DIAGNOSIS — Z87891 Personal history of nicotine dependence: Secondary | ICD-10-CM | POA: Diagnosis not present

## 2019-05-18 DIAGNOSIS — H60312 Diffuse otitis externa, left ear: Secondary | ICD-10-CM | POA: Diagnosis not present

## 2019-07-03 DIAGNOSIS — Z124 Encounter for screening for malignant neoplasm of cervix: Secondary | ICD-10-CM | POA: Diagnosis not present

## 2019-07-03 DIAGNOSIS — O99345 Other mental disorders complicating the puerperium: Secondary | ICD-10-CM | POA: Diagnosis not present

## 2019-07-03 DIAGNOSIS — O24430 Gestational diabetes mellitus in the puerperium, diet controlled: Secondary | ICD-10-CM | POA: Diagnosis not present

## 2019-07-03 DIAGNOSIS — Z3009 Encounter for other general counseling and advice on contraception: Secondary | ICD-10-CM | POA: Diagnosis not present

## 2019-07-03 DIAGNOSIS — F419 Anxiety disorder, unspecified: Secondary | ICD-10-CM | POA: Diagnosis not present

## 2020-02-10 NOTE — Progress Notes (Deleted)
Complete Physical  Assessment and Plan: BMI 24.0-24.9, adult Requested phentermine but with normal BMI, informed her that she does not need it, continue healthy eating, working out to tone.  -     TSH  Vitamin D deficiency -     TSH -     VITAMIN D 25 Hydroxy (Vit-D Deficiency, Fractures)  Medication management -     CBC with Differential/Platelet -     BASIC METABOLIC PANEL WITH GFR -     Hepatic function panel -     Magnesium  Routine general medical examination at a health care facility Need STD screening, declines today  Screening cholesterol level -     Lipid panel  Screening for blood or protein in urine -     Urinalysis, Routine w reflex microscopic -     Microalbumin / creatinine urine ratio  Screening, anemia, deficiency, iron -     Iron,Total/Total Iron Binding Cap -     Ferritin -     Vitamin B12   Discussed med's effects and SE's. Screening labs and tests as requested with regular follow-up as recommended. Over 40 minutes of exam, counseling, chart review, and complex, high level critical decision making was performed this visit.   HPI  24 y.o. female  presents for a complete physical after being lost for follow up and follow up for has Acne; Constipation; Vitamin D deficiency; and Medication management on their problem list..  Patient is postpartum with baby, had 3rd trimester gestational DM and anxiety- she is on buspar for anxiety.   Her blood pressure has been controlled at home, today their BP is   She does workout. She denies chest pain, shortness of breath, dizziness.   BMI is There is no height or weight on file to calculate BMI., she is working on diet and exercise. Wt Readings from Last 3 Encounters:  01/27/18 156 lb 12.8 oz (71.1 kg) (85 %, Z= 1.04)*  04/26/17 161 lb 9.6 oz (73.3 kg) (89 %, Z= 1.22)*  04/08/17 170 lb 9.6 oz (77.4 kg) (92 %, Z= 1.43)*   * Growth percentiles are based on CDC (Girls, 2-20 Years) data.    She is not on  cholesterol medication and denies myalgias. Her cholesterol is not at goal. The cholesterol last visit was:   Lab Results  Component Value Date   CHOL 140 01/27/2018   HDL 64 01/27/2018   LDLCALC 58 01/27/2018   TRIG 99 (H) 01/27/2018   CHOLHDL 2.2 01/27/2018    Last A1C in the office was:  Lab Results  Component Value Date   HGBA1C 5.2 02/22/2016   Patient is on Vitamin D supplement.   Lab Results  Component Value Date   VD25OH 38 01/27/2018       Current Medications:  No current outpatient medications on file prior to visit.   No current facility-administered medications on file prior to visit.   Allergies:  No Known Allergies   Medical History:  She has Acne; Constipation; Vitamin D deficiency; and Medication management on their problem list.   Health Maintenance:   Immunization History  Administered Date(s) Administered  . DTaP 04/15/1998, 06/17/1998, 08/19/1998, 06/01/1999, 07/09/2003  . HPV Quadrivalent 05/30/2007, 07/31/2007, 09/01/2008  . Hepatitis A 05/30/2007, 09/01/2008  . Hepatitis B 01/06/98, 04/15/1998, 11/18/1998  . HiB (PRP-OMP) 04/15/1998, 06/17/1998, 06/01/1999  . IPV 04/15/1998, 06/17/1998, 02/17/1999, 07/09/2003  . Influenza Nasal 09/30/2009, 10/10/2010  . MMR 02/17/1999, 07/09/2003  . Meningococcal Conjugate 07/01/2009  . Rotavirus Pentavalent  04/15/1998  . Tdap 09/01/2008  . Varicella 02/17/1999   Tetanus: states got 2-3 years ago Pneumovax: Prevnar 13:  Flu vaccine: declines Zostavax: HPV completed  No LMP recorded. Pap: 06/2020 Has nexplanon She is sexually active, declines testing today MGM:  DEXA: Colonoscopy: EGD:  Patient Care Team: Unk Pinto, MD as PCP - General (Internal Medicine)  Surgical History:  She has no past surgical history on file. Family History:  Herfamily history includes Heart disease in her father; Hypertension in her father. Social History:  She reports that she has been smoking. She has  never used smokeless tobacco. She reports current alcohol use. She reports that she does not use drugs.  Review of Systems: Review of Systems  Constitutional: Negative.   HENT: Negative.   Eyes: Negative.   Respiratory: Negative.   Cardiovascular: Negative.   Gastrointestinal: Negative.   Genitourinary: Negative.   Musculoskeletal: Negative.   Skin: Negative.     Physical Exam: Estimated body mass index is 24.56 kg/m as calculated from the following:   Height as of 01/27/18: 5' 7"  (1.702 m).   Weight as of 01/27/18: 156 lb 12.8 oz (71.1 kg). There were no vitals taken for this visit. General Appearance: Well nourished, in no apparent distress.  Eyes: PERRLA, EOMs, conjunctiva no swelling or erythema, normal fundi and vessels.  Sinuses: No Frontal/maxillary tenderness  ENT/Mouth: Ext aud canals clear, normal light reflex with TMs without erythema, bulging. Good dentition. No erythema, swelling, or exudate on post pharynx. Tonsils not swollen or erythematous. Hearing normal.  Neck: Supple, thyroid normal. No bruits  Respiratory: Respiratory effort normal, BS equal bilaterally without rales, rhonchi, wheezing or stridor.  Cardio: RRR without murmurs, rubs or gallops. Brisk peripheral pulses without edema.  Chest: symmetric, with normal excursions and percussion.  Breasts: defer GYN  Abdomen: Soft, nontender, no guarding, rebound, hernias, masses, or organomegaly.  Lymphatics: Non tender without lymphadenopathy.  Genitourinary: defer GYN Musculoskeletal: Full ROM all peripheral extremities,5/5 strength, and normal gait.  Skin: Warm, dry without rashes, lesions, ecchymosis. Neuro: Cranial nerves intact, reflexes equal bilaterally. Normal muscle tone, no cerebellar symptoms. Sensation intact.  Psych: Awake and oriented X 3, normal affect, Insight and Judgment appropriate.   EKG: WNL no ST changes. AORTA SCAN: defer  Vicie Mutters 1:22 PM [facility]

## 2020-02-11 ENCOUNTER — Encounter: Payer: Self-pay | Admitting: Physician Assistant

## 2020-03-08 NOTE — Progress Notes (Signed)
Complete Physical  Assessment and Plan: Encounter for general adult medical examination with abnormal findings 1 year  Acne, unspecified acne type Controlled at this time  Vitamin D deficiency -     VITAMIN D 25 Hydroxy (Vit-D Deficiency, Fractures)  Medication management -     CBC with Differential/Platelet -     COMPLETE METABOLIC PANEL WITH GFR -     Magnesium  Constipation, unspecified constipation type -     TSH -     Ambulatory referral to Physical Therapy - Increase fiber/ water intake, decrease caffeine, increase activity level, can add bulk (psyllium) until BM soft. Laboratory tests per orders. Please go to the hospital if you have severe abdominal pain, vomiting, fever, CP, SOB.   Screening cholesterol level -     Lipid panel  Screening for diabetes mellitus -     Hemoglobin A1c  Screening for hematuria or proteinuria -     Urinalysis, Routine w reflex microscopic  Screening, anemia, deficiency, iron -     Vitamin B12 -     Iron,Total/Total Iron Binding Cap  Dyspareunia, female -     Ambulatory referral to Physical Therapy - no discharge, no symptoms, following GYN Likely secondary to recent childbirth  BMI 29.0-29.9,adult -     phentermine (ADIPEX-P) 37.5 MG tablet; Take 1 tablet (37.5 mg total) by mouth daily before breakfast. Phone visit 6 weeks, follow up 3 months after 90 day trial at new job  Discussed med's effects and SE's. Screening labs and tests as requested with regular follow-up as recommended. Over 40 minutes of exam, counseling, chart review, and complex, high level critical decision making was performed this visit.   HPI  24 y.o. female  presents for a complete physical after being lost for follow up and follow up for has Acne; Constipation; Vitamin D deficiency; and Medication management on their problem list..  Patient is postpartum with baby, had 3rd trimester gestational DM and anxiety- she was on buspar for anxiety, feeling better.    Her blood pressure has been controlled at home, today their BP is BP: 106/66 She does workout. She denies chest pain, shortness of breath, dizziness.   BMI is Body mass index is 29.1 kg/m., she is working on diet and exercise. She is off sodas, she is on nexplanon, starts new job Monday, wants to try to start working out. Would like to get back on phentermine. She is not breast feeding.  Wt Readings from Last 3 Encounters:  03/09/20 185 lb 12.8 oz (84.3 kg)  01/27/18 156 lb 12.8 oz (71.1 kg) (85 %, Z= 1.04)*  04/26/17 161 lb 9.6 oz (73.3 kg) (89 %, Z= 1.22)*   * Growth percentiles are based on CDC (Girls, 2-20 Years) data.    She is not on cholesterol medication and denies myalgias. Her cholesterol is not at goal. The cholesterol last visit was:   Lab Results  Component Value Date   CHOL 156 03/09/2020   HDL 43 (L) 03/09/2020   LDLCALC 90 03/09/2020   TRIG 132 03/09/2020   CHOLHDL 3.6 03/09/2020    Last A1C in the office was:  Lab Results  Component Value Date   HGBA1C 5.0 03/09/2020   Patient is on Vitamin D supplement.   Lab Results  Component Value Date   VD25OH 18 (L) 03/09/2020       Current Medications:  No current outpatient medications on file prior to visit.   No current facility-administered medications on file prior to  visit.   Allergies:  No Known Allergies   Medical History:  She has Acne; Constipation; Vitamin D deficiency; and Medication management on their problem list.   Health Maintenance:   Immunization History  Administered Date(s) Administered  . DTaP 04/15/1998, 06/17/1998, 08/19/1998, 06/01/1999, 07/09/2003  . HPV Quadrivalent 05/30/2007, 07/31/2007, 09/01/2008  . Hepatitis A 05/30/2007, 09/01/2008  . Hepatitis B 14-Mar-1998, 04/15/1998, 11/18/1998  . HiB (PRP-OMP) 04/15/1998, 06/17/1998, 06/01/1999  . IPV 04/15/1998, 06/17/1998, 02/17/1999, 07/09/2003  . Influenza Nasal 09/30/2009, 10/10/2010  . MMR 02/17/1999, 07/09/2003  .  Meningococcal Conjugate 07/01/2009  . Rotavirus Pentavalent 04/15/1998  . Tdap 09/01/2008  . Varicella 02/17/1999   Tetanus: states got 2-3 years ago Pneumovax: Prevnar 13:  Flu vaccine: declines Zostavax: HPV completed  No LMP recorded. Pap: 06/2020 Has nexplanon She is sexually active, declines testing today MGM:  DEXA: Colonoscopy: EGD:  Patient Care Team: Unk Pinto, MD as PCP - General (Internal Medicine)  Surgical History:  She has no past surgical history on file. Family History:  Herfamily history includes Heart disease in her father; Hypertension in her father. Social History:  She reports that she has been smoking. She has never used smokeless tobacco. She reports current alcohol use. She reports that she does not use drugs.  Review of Systems: Review of Systems  Constitutional: Negative.   HENT: Negative.   Eyes: Negative.   Respiratory: Negative.   Cardiovascular: Negative.   Gastrointestinal: Negative.   Genitourinary: Negative.   Musculoskeletal: Negative.   Skin: Negative.     Physical Exam: Estimated body mass index is 29.1 kg/m as calculated from the following:   Height as of this encounter: '5\' 7"'  (1.702 m).   Weight as of this encounter: 185 lb 12.8 oz (84.3 kg). BP 106/66   Pulse 80   Temp (!) 96 F (35.6 C)   Resp 16   Ht '5\' 7"'  (1.702 m)   Wt 185 lb 12.8 oz (84.3 kg)   BMI 29.10 kg/m  General Appearance: Well nourished, in no apparent distress.  Eyes: PERRLA, EOMs, conjunctiva no swelling or erythema, normal fundi and vessels.  Sinuses: No Frontal/maxillary tenderness  ENT/Mouth: Ext aud canals clear, normal light reflex with TMs without erythema, bulging. Good dentition. No erythema, swelling, or exudate on post pharynx. Tonsils not swollen or erythematous. Hearing normal.  Neck: Supple, thyroid normal. No bruits  Respiratory: Respiratory effort normal, BS equal bilaterally without rales, rhonchi, wheezing or stridor.  Cardio:  RRR without murmurs, rubs or gallops. Brisk peripheral pulses without edema.  Chest: symmetric, with normal excursions and percussion.  Breasts: defer GYN  Abdomen: Soft, nontender, no guarding, rebound, hernias, masses, or organomegaly.  Lymphatics: Non tender without lymphadenopathy.  Genitourinary: defer GYN Musculoskeletal: Full ROM all peripheral extremities,5/5 strength, and normal gait.  Skin: Warm, dry without rashes, lesions, ecchymosis. Neuro: Cranial nerves intact, reflexes equal bilaterally. Normal muscle tone, no cerebellar symptoms. Sensation intact.  Psych: Awake and oriented X 3, normal affect, Insight and Judgment appropriate.   EKG: WNL no ST changes. AORTA SCAN: defer  Robin Morris 1:38 PM [facility]

## 2020-03-09 ENCOUNTER — Other Ambulatory Visit: Payer: Self-pay

## 2020-03-09 ENCOUNTER — Ambulatory Visit (INDEPENDENT_AMBULATORY_CARE_PROVIDER_SITE_OTHER): Payer: BLUE CROSS/BLUE SHIELD | Admitting: Physician Assistant

## 2020-03-09 VITALS — BP 106/66 | HR 80 | Temp 96.0°F | Resp 16 | Ht 67.0 in | Wt 185.8 lb

## 2020-03-09 DIAGNOSIS — Z0000 Encounter for general adult medical examination without abnormal findings: Secondary | ICD-10-CM

## 2020-03-09 DIAGNOSIS — Z6829 Body mass index (BMI) 29.0-29.9, adult: Secondary | ICD-10-CM

## 2020-03-09 DIAGNOSIS — Z0001 Encounter for general adult medical examination with abnormal findings: Secondary | ICD-10-CM

## 2020-03-09 DIAGNOSIS — Z1389 Encounter for screening for other disorder: Secondary | ICD-10-CM

## 2020-03-09 DIAGNOSIS — Z131 Encounter for screening for diabetes mellitus: Secondary | ICD-10-CM

## 2020-03-09 DIAGNOSIS — E559 Vitamin D deficiency, unspecified: Secondary | ICD-10-CM | POA: Diagnosis not present

## 2020-03-09 DIAGNOSIS — Z1329 Encounter for screening for other suspected endocrine disorder: Secondary | ICD-10-CM | POA: Diagnosis not present

## 2020-03-09 DIAGNOSIS — Z79899 Other long term (current) drug therapy: Secondary | ICD-10-CM

## 2020-03-09 DIAGNOSIS — N941 Unspecified dyspareunia: Secondary | ICD-10-CM

## 2020-03-09 DIAGNOSIS — Z130 Encounter for screening for diseases of the blood and blood-forming organs and certain disorders involving the immune mechanism: Secondary | ICD-10-CM

## 2020-03-09 DIAGNOSIS — K5900 Constipation, unspecified: Secondary | ICD-10-CM

## 2020-03-09 DIAGNOSIS — Z13220 Encounter for screening for lipoid disorders: Secondary | ICD-10-CM | POA: Diagnosis not present

## 2020-03-09 DIAGNOSIS — L709 Acne, unspecified: Secondary | ICD-10-CM

## 2020-03-09 MED ORDER — PHENTERMINE HCL 37.5 MG PO TABS
37.5000 mg | ORAL_TABLET | Freq: Every day | ORAL | 2 refills | Status: DC
Start: 2020-03-09 — End: 2020-05-27

## 2020-03-09 NOTE — Patient Instructions (Addendum)
Counseling services   I suggest calling your insurance and finding out who is in your network and THEN calling those people or looking them up on google.   I'm a big fan of Cognitive Behavioral Therapy, look this up on You tube or check with the therapist you see if they are certified.  This form of therapy helps to teach you skills to better handle with current situation that are causing anxiety or depression.   There are some great apps too Check out [place], give thanks app.  Meditations apps are great like headspace.    Phentermine  While taking the medication we may ask that you come into the office once a month. The first month we will get an EKG on you.   PLEASE BRING A FOOD LONG TO THE FIRST VISIT.   It is helpful if you bring in a food diary or use an app on your phone such as myfitnesspal to record your calorie intake, especially in the beginning.  After that first initial visit, we will want to see you once a month for accountability.  In addition we can help answer your questions about diet, exercise, and help you every step of the way with your weight loss journey.  HOW TO START THE MEDICATION You can start out on 1/2 a pill in the morning  FOR THE FIRST MONTH.  WE WILL THEN INCREASE TO 1 PILL AFTER THAT 1ST VISIT.   COST OF THE MEDICATION This medication is cheapest CASH pay at [facility] is 14-17 dollars and you do NOT need a membership to get meds from there.   SIDE EFFECTS It causes dry mouth and constipation in almost every patient, so try to get 80-100 oz of water a day and increase fiber such as veggies. You can add on a stool softener if you would like.   It can give you energy however it can also cause some people to be shaky, anxious or have palpitations. Stop this medication if that happens and contact the office.   If this medication does not work for you there are several medications that we can try to help rewire your brain in addition  to making healthier habits.   What is this medicine? PHENTERMINE (FEN ter meen) decreases your appetite. This medicine is intended to be used in addition to a healthy reduced calorie diet and exercise. The best results are achieved this way. This medicine is only indicated for short-term use. Eventually your weight loss may level out and the medication will no longer be needed.   How should I use this medicine? Take this medicine by mouth. Follow the directions on the prescription label. The tablets should stay in the bottle until immediately before you take your dose. Take your doses at regular intervals. Do not take your medicine more often than directed.  Overdosage: If you think you have taken too much of this medicine contact a poison control center or emergency room at once. NOTE: This medicine is only for you. Do not share this medicine with others.  What if I miss a dose? If you miss a dose, take it as soon as you can. If it is almost time for your next dose, take only that dose. Do not take double or extra doses. Do not increase or in any way change your dose without consulting your doctor.  What should I watch for while using this medicine? Notify your physician immediately if you become short  of breath while doing your normal activities. Do not take this medicine within 6 hours of bedtime. It can keep you from getting to sleep. Avoid drinks that contain caffeine and try to stick to a regular bedtime every night. Do not stand or sit up quickly, especially if you are an older patient. This reduces the risk of dizzy or fainting spells. Avoid alcoholic drinks.  What side effects may I notice from receiving this medicine? Side effects that you should report to your doctor or health care professional as soon as possible: -chest pain, palpitations -depression or severe changes in mood -increased blood pressure -irritability -nervousness or restlessness -severe dizziness -shortness of  breath -problems urinating -unusual swelling of the legs -vomiting  Side effects that usually do not require medical attention (report to your doctor or health care professional if they continue or are bothersome): -blurred vision or other eye problems -changes in sexual ability or desire -constipation or diarrhea -difficulty sleeping -dry mouth or unpleasant taste -headache -nausea This list may not describe all possible side effects. Call your doctor for medical advice about side effects. You may report side effects to FDA at 1-800-FDA-1088.

## 2020-03-10 LAB — COMPLETE METABOLIC PANEL WITH GFR
AG Ratio: 1.5 (calc) (ref 1.0–2.5)
ALT: 9 U/L (ref 6–29)
AST: 14 U/L (ref 10–30)
Albumin: 4.9 g/dL (ref 3.6–5.1)
Alkaline phosphatase (APISO): 79 U/L (ref 31–125)
BUN: 11 mg/dL (ref 7–25)
CO2: 29 mmol/L (ref 20–32)
Calcium: 10.3 mg/dL — ABNORMAL HIGH (ref 8.6–10.2)
Chloride: 102 mmol/L (ref 98–110)
Creat: 0.59 mg/dL (ref 0.50–1.10)
GFR, Est African American: 151 mL/min/{1.73_m2} (ref >=60)
GFR, Est Non African American: 130 mL/min/{1.73_m2} (ref >=60)
Globulin: 3.2 g/dL (calc) (ref 1.9–3.7)
Glucose, Bld: 88 mg/dL (ref 65–99)
Potassium: 4.2 mmol/L (ref 3.5–5.3)
Sodium: 138 mmol/L (ref 135–146)
Total Bilirubin: 0.8 mg/dL (ref 0.2–1.2)
Total Protein: 8.1 g/dL (ref 6.1–8.1)

## 2020-03-10 LAB — VITAMIN D 25 HYDROXY (VIT D DEFICIENCY, FRACTURES): Vit D, 25-Hydroxy: 18 ng/mL — ABNORMAL LOW (ref 30–100)

## 2020-03-10 LAB — CBC WITH DIFFERENTIAL/PLATELET
Absolute Monocytes: 783 cells/uL (ref 200–950)
Basophils Absolute: 36 cells/uL (ref 0–200)
Basophils Relative: 0.4 %
Eosinophils Absolute: 142 cells/uL (ref 15–500)
Eosinophils Relative: 1.6 %
HCT: 46 % — ABNORMAL HIGH (ref 35.0–45.0)
Hemoglobin: 15.6 g/dL — ABNORMAL HIGH (ref 11.7–15.5)
Lymphs Abs: 1611 cells/uL (ref 850–3900)
MCH: 30.6 pg (ref 27.0–33.0)
MCHC: 33.9 g/dL (ref 32.0–36.0)
MCV: 90.2 fL (ref 80.0–100.0)
MPV: 9.9 fL (ref 7.5–12.5)
Monocytes Relative: 8.8 %
Neutro Abs: 6328 cells/uL (ref 1500–7800)
Neutrophils Relative %: 71.1 %
Platelets: 283 10*3/uL (ref 140–400)
RBC: 5.1 10*6/uL (ref 3.80–5.10)
RDW: 11.9 % (ref 11.0–15.0)
Total Lymphocyte: 18.1 %
WBC: 8.9 10*3/uL (ref 3.8–10.8)

## 2020-03-10 LAB — URINALYSIS, ROUTINE W REFLEX MICROSCOPIC
Bilirubin Urine: NEGATIVE
Glucose, UA: NEGATIVE
Hgb urine dipstick: NEGATIVE
Ketones, ur: NEGATIVE
Leukocytes,Ua: NEGATIVE
Nitrite: NEGATIVE
Protein, ur: NEGATIVE
Specific Gravity, Urine: 1.026 (ref 1.001–1.03)
pH: 6.5 (ref 5.0–8.0)

## 2020-03-10 LAB — HEMOGLOBIN A1C
Hgb A1c MFr Bld: 5 % of total Hgb (ref <5.7)
Mean Plasma Glucose: 97 (calc)
eAG (mmol/L): 5.4 (calc)

## 2020-03-10 LAB — MAGNESIUM: Magnesium: 2.1 mg/dL (ref 1.5–2.5)

## 2020-03-10 LAB — VITAMIN B12: Vitamin B-12: 347 pg/mL (ref 200–1100)

## 2020-03-10 LAB — LIPID PANEL
Cholesterol: 156 mg/dL (ref <200)
HDL: 43 mg/dL — ABNORMAL LOW (ref >=50)
LDL Cholesterol (Calc): 90 mg/dL (calc)
Non-HDL Cholesterol (Calc): 113 mg/dL (calc) (ref <130)
Total CHOL/HDL Ratio: 3.6 (calc) (ref <5.0)
Triglycerides: 132 mg/dL (ref <150)

## 2020-03-10 LAB — IRON, TOTAL/TOTAL IRON BINDING CAP
%SAT: 25 % (calc) (ref 16–45)
Iron: 98 ug/dL (ref 40–190)
TIBC: 393 mcg/dL (calc) (ref 250–450)

## 2020-03-10 LAB — TSH: TSH: 0.64 mIU/L

## 2020-04-19 NOTE — Progress Notes (Signed)
COULD NOT CONTACT THE PATIENT This encounter was created in error - please disregard.

## 2020-04-20 ENCOUNTER — Encounter: Payer: Self-pay | Admitting: Physician Assistant

## 2020-04-20 ENCOUNTER — Encounter: Payer: BLUE CROSS/BLUE SHIELD | Admitting: Physician Assistant

## 2020-04-20 DIAGNOSIS — K5900 Constipation, unspecified: Secondary | ICD-10-CM

## 2020-05-12 ENCOUNTER — Ambulatory Visit: Payer: BLUE CROSS/BLUE SHIELD | Admitting: Physician Assistant

## 2020-05-17 NOTE — Progress Notes (Signed)
Patient No show for phone visit x 2 My nurse attempted to call both numbers 2 x and I myself attempted to call both numbers- LM   Will keep July appointment but no refill medicine.   Allergies as of 05/18/2020   No Known Allergies     Medication List       Accurate as of May 18, 2020 12:09 PM. If you have any questions, ask your nurse or doctor.        phentermine 37.5 MG tablet Commonly known as: ADIPEX-P Take 1 tablet (37.5 mg total) by mouth daily before breakfast.

## 2020-05-18 ENCOUNTER — Ambulatory Visit: Payer: BLUE CROSS/BLUE SHIELD | Admitting: Physician Assistant

## 2020-05-18 ENCOUNTER — Other Ambulatory Visit: Payer: Self-pay

## 2020-05-18 ENCOUNTER — Encounter: Payer: Self-pay | Admitting: Physician Assistant

## 2020-05-18 DIAGNOSIS — Z6829 Body mass index (BMI) 29.0-29.9, adult: Secondary | ICD-10-CM

## 2020-05-26 NOTE — Progress Notes (Signed)
24 y.o.female presents for a follow up after being on phentermine for weight loss.   While on the medication they have lost 6 lbs since last visit. She had some anxiety when first took it but she states this got better and now she is on the full pill and feels that it is not helping as much.  They deny palpitations, anxiety, trouble sleeping, elevated BP.   BP Readings from Last 3 Encounters:  05/27/20 118/74  03/09/20 106/66  01/27/18 118/76    BMI is Body mass index is 28.04 kg/m., she is working on diet and exercise. Wt Readings from Last 3 Encounters:  05/27/20 179 lb (81.2 kg)  03/09/20 185 lb 12.8 oz (84.3 kg)  01/27/18 156 lb 12.8 oz (71.1 kg) (85 %, Z= 1.04)*   * Growth percentiles are based on CDC (Girls, 2-20 Years) data.    Medications:       Current Outpatient Medications (Other):  .  phentermine (ADIPEX-P) 37.5 MG tablet, Take 1 tablet (37.5 mg total) by mouth daily before breakfast.  ROS: All negative except for above  Physical exam: Vitals:   05/27/20 1008  BP: 118/74  Pulse: 77  Temp: 97.7 F (36.5 C)  SpO2: 99%   Physical Exam Constitutional:      Appearance: Normal appearance. She is normal weight. She is not ill-appearing.  Eyes:     Extraocular Movements: Extraocular movements intact.     Conjunctiva/sclera: Conjunctivae normal.  Musculoskeletal:        General: Normal range of motion.  Neurological:     General: No focal deficit present.     Mental Status: She is oriented to person, place, and time.  Psychiatric:        Mood and Affect: Mood normal.        Behavior: Behavior normal.        Thought Content: Thought content normal.     Assessment: Obesity with co morbid conditions.   Plan: She will work on meal planning, intentional eating, and increasing water.  She has been instructed to work up to a goal of 150 minutes of combined cardio and strengthening exercise per week for weight loss and overall health benefits. We discussed  the following Behavioral Modification Strategies today: increasing lean protein intake, decreasing simple carbohydrates, increasing vegetables, increase H20 intake, decrease eating out, no skipping meals, work on meal planning and easy cooking plans, keeping healthy foods in the home, and planning for success.   She has agreed to follow-up with our clinic in  2 months Will add on topamax to phentermine Will keep food log   Future Appointments  Date Time Provider Department Center  06/09/2020  2:30 PM Quentin Mulling, PA-C [id number] None

## 2020-05-27 ENCOUNTER — Encounter: Payer: Self-pay | Admitting: Physician Assistant

## 2020-05-27 ENCOUNTER — Other Ambulatory Visit: Payer: Self-pay

## 2020-05-27 ENCOUNTER — Ambulatory Visit (INDEPENDENT_AMBULATORY_CARE_PROVIDER_SITE_OTHER): Payer: BLUE CROSS/BLUE SHIELD | Admitting: Physician Assistant

## 2020-05-27 DIAGNOSIS — Z6829 Body mass index (BMI) 29.0-29.9, adult: Secondary | ICD-10-CM

## 2020-05-27 MED ORDER — TOPIRAMATE 50 MG PO TABS: 50.0000 mg | ORAL_TABLET | Freq: Every day | ORAL | 2 refills | Status: AC

## 2020-05-27 MED ORDER — PHENTERMINE HCL 37.5 MG PO TABS: 37.5000 mg | ORAL_TABLET | Freq: Every day | ORAL | 2 refills | Status: AC

## 2020-05-27 NOTE — Patient Instructions (Addendum)
Who Qualifies for Obesity Medications? Although everyone is hopeful for a fast and easy way to lose weight, nothing has been shown to replace a prudent, calorie-controlled diet along with behavior modification as a cornerstone for all obesity treatments.   The next tool that can be used to achieve weight-loss and health improvement is medication.   Pharmacotherapy may be offered to Individuals affected by obesity who have failed to achieve weight-loss through diet and exercise alone.  We have decided to try you on a weight loss medication here is some general information about this medication.   If this does not help we will take you off the phentermine for a few months and then put you back on or we can discuss other medications.   Suggest getting food diary and letting me see it   TOPIRAMATE Sometimes we will prescribe topiramate AND phentermine together to make Qsymia- separating the medications makes it cheaper.  Or topiramate can be used by itself for weight loss at night.   How to start it start on 1/2 pill for 3-5 nights, can increase to a whole pill for 1-2 weeks.  This medication is good for weight loss, headaches, pain This medication can cause numbness, tingling and can cause brain fog- stop if you get these Check with your eye doctor if you have a history of glaucoma and stop if you severe vision changes or blurry vision.  There is a long acting medication that we can switch to if you have numbness,tingling or brain fog.   Topiramate has been associated with an increased risk of birth defects- and should NOT be taken if pregnant or becoming pregnant.   Follow-up Visits: Frequent visits (every 3 to 4 weeks) are encouraged until initial weight-loss goals (5 to 10 percent of body weight) are achieved.   At that point, less frequent visits are typically scheduled as needed for individual patients. However, since obesity is considered a chronic life-long problem for many  individuals, periodic continual follow up is recommended.  Research has shown that weight-loss as low as 5 percent of initial body weight can lead to favorable improvements in blood pressure, cholesterol, glucose levels and insulin sensitivity. The risk of developing heart disease is reduced the most in patients who have impaired glucose tolerance, type 2 diabetes or high blood pressure.

## 2020-06-09 ENCOUNTER — Ambulatory Visit: Payer: BLUE CROSS/BLUE SHIELD | Admitting: Physician Assistant

## 2021-02-14 ENCOUNTER — Encounter: Payer: BLUE CROSS/BLUE SHIELD | Admitting: Physician Assistant

## 2021-11-20 DIAGNOSIS — R000 Tachycardia, unspecified: Secondary | ICD-10-CM | POA: Diagnosis not present

## 2021-11-20 DIAGNOSIS — Z1389 Encounter for screening for other disorder: Secondary | ICD-10-CM | POA: Diagnosis not present

## 2021-11-20 DIAGNOSIS — F4325 Adjustment disorder with mixed disturbance of emotions and conduct: Secondary | ICD-10-CM | POA: Diagnosis not present

## 2021-11-20 DIAGNOSIS — Z0000 Encounter for general adult medical examination without abnormal findings: Secondary | ICD-10-CM | POA: Diagnosis not present

## 2021-11-27 DIAGNOSIS — F902 Attention-deficit hyperactivity disorder, combined type: Secondary | ICD-10-CM | POA: Diagnosis not present

## 2021-11-27 DIAGNOSIS — F4322 Adjustment disorder with anxiety: Secondary | ICD-10-CM | POA: Diagnosis not present

## 2021-12-28 DIAGNOSIS — F17200 Nicotine dependence, unspecified, uncomplicated: Secondary | ICD-10-CM | POA: Diagnosis not present

## 2021-12-28 DIAGNOSIS — F4322 Adjustment disorder with anxiety: Secondary | ICD-10-CM | POA: Diagnosis not present

## 2021-12-28 DIAGNOSIS — F902 Attention-deficit hyperactivity disorder, combined type: Secondary | ICD-10-CM | POA: Diagnosis not present

## 2021-12-28 DIAGNOSIS — Z0000 Encounter for general adult medical examination without abnormal findings: Secondary | ICD-10-CM | POA: Diagnosis not present

## 2021-12-28 DIAGNOSIS — R000 Tachycardia, unspecified: Secondary | ICD-10-CM | POA: Diagnosis not present

## 2022-03-15 DIAGNOSIS — F902 Attention-deficit hyperactivity disorder, combined type: Secondary | ICD-10-CM | POA: Diagnosis not present

## 2022-04-16 DIAGNOSIS — F902 Attention-deficit hyperactivity disorder, combined type: Secondary | ICD-10-CM | POA: Diagnosis not present

## 2022-09-06 DIAGNOSIS — F902 Attention-deficit hyperactivity disorder, combined type: Secondary | ICD-10-CM | POA: Diagnosis not present

## 2024-02-13 ENCOUNTER — Encounter: Payer: Self-pay | Admitting: *Deleted
